# Patient Record
Sex: Male | Born: 1985 | Race: Black or African American | Hispanic: No | Marital: Single | Smoking: Former smoker
Health system: Southern US, Community
[De-identification: ages and names within clinical notes are randomized; demographics above are authoritative.]

## PROBLEM LIST (undated history)

## (undated) DIAGNOSIS — J45909 Unspecified asthma, uncomplicated: Secondary | ICD-10-CM

---
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
## 2012-07-10 ENCOUNTER — Emergency Department (HOSPITAL_COMMUNITY)
Admission: EM | Admit: 2012-07-10 | Discharge: 2012-07-10 | Disposition: A | Discharge status: Home / Self Care | Payer: Self-pay | Attending: Emergency Medicine | Admitting: Emergency Medicine

## 2012-07-10 ENCOUNTER — Encounter (HOSPITAL_COMMUNITY): Payer: Self-pay | Admitting: Emergency Medicine

## 2012-07-10 DIAGNOSIS — E860 Dehydration: Secondary | ICD-10-CM | POA: Insufficient documentation

## 2012-07-10 DIAGNOSIS — F10929 Alcohol use, unspecified with intoxication, unspecified: Secondary | ICD-10-CM

## 2012-07-10 DIAGNOSIS — F1010 Alcohol abuse, uncomplicated: Secondary | ICD-10-CM | POA: Insufficient documentation

## 2012-07-10 LAB — POCT I-STAT, CHEM 8
Chloride: 103 mEq/L (ref 96–112)
HCT: 48 % (ref 39.0–52.0)
Potassium: 4 mEq/L (ref 3.5–5.1)

## 2012-07-10 LAB — ETHANOL: Alcohol, Ethyl (B): 206 mg/dL — ABNORMAL HIGH (ref 0–11)

## 2012-07-10 LAB — RAPID URINE DRUG SCREEN, HOSP PERFORMED: Barbiturates: NOT DETECTED

## 2012-07-10 MED ORDER — SODIUM CHLORIDE 0.9 % IV BOLUS (SEPSIS)
1000.0000 mL | Freq: Once | INTRAVENOUS | Status: AC
  Administered 2012-07-10: 1000 mL via INTRAVENOUS

## 2012-07-10 NOTE — ED Notes (Signed)
Patient intoxicated at this time, awake and alert.  Mom and aunt at bedside.  Mom states that patient is an alcoholic and that he just passed out on porch.  Patient did not have any complaints prior to EMS coming to house.  Patient states that he is fine and wants to go home.

## 2012-07-10 NOTE — ED Notes (Addendum)
Patient found by EMS on porch, lethargic.  Neighbors found him unresponsive prior to EMS arrival.  Patient has pinpoint pupils, denies any intake of drugs.  Patient remains lethargic, but becoming more alert en route to ED.  CBG of 81, ETOH on board.

## 2012-07-10 NOTE — ED Provider Notes (Signed)
History     CSN: [id number]  Arrival date & time 07/10/12  2039   First MD Initiated Contact with Patient 07/10/12 2121      Chief Complaint  Patient presents with  . Ingestion    (Consider location/radiation/quality/duration/timing/severity/associated sxs/prior treatment) HPI Pt brought to the ED via EMS from home where he was apparently found "unresponsive" on the front porch. He admits to drinking a bunch of beer this afternoon, but denies any other ingestions. Denies any other complaints.   History reviewed. No pertinent past medical history.  History reviewed. No pertinent past surgical history.  History reviewed. No pertinent family history.  History  Substance Use Topics  . Smoking status: Never Smoker   . Smokeless tobacco: Not on file  . Alcohol Use: Yes      Review of Systems All other systems reviewed and are negative except as noted in HPI.   Allergies  Strawberry  Home Medications  No current outpatient prescriptions on file.  BP 115/71  Pulse 69  Temp 97.5 F (36.4 C) (Oral)  Resp 16  SpO2 100%  Physical Exam  Nursing note and vitals reviewed. Constitutional: He appears well-developed and well-nourished.  HENT:  Head: Normocephalic and atraumatic.  Eyes: EOM are normal. Pupils are equal, round, and reactive to light.  Neck: Normal range of motion. Neck supple.  Cardiovascular: Normal rate, normal heart sounds and intact distal pulses.   Pulmonary/Chest: Effort normal and breath sounds normal.  Abdominal: Bowel sounds are normal. He exhibits no distension. There is no tenderness.  Musculoskeletal: Normal range of motion. He exhibits no edema and no tenderness.  Neurological: He is alert. He has normal strength. No cranial nerve deficit or sensory deficit.  Skin: Skin is warm and dry. No rash noted.  Psychiatric: He has a normal mood and affect.    ED Course  Procedures (including critical care time)  Labs Reviewed  URINE RAPID DRUG  SCREEN (HOSP PERFORMED) - Abnormal; Notable for the following:    Tetrahydrocannabinol POSITIVE (*)     All other components within normal limits  ETHANOL - Abnormal; Notable for the following:    Alcohol, Ethyl (B) 206 (*)     All other components within normal limits  POCT I-STAT, CHEM 8 - Abnormal; Notable for the following:    BUN 3 (*)     Creatinine, Ser 1.40 (*)     Glucose, Bld 100 (*)     All other components within normal limits   No results found.   No diagnosis found.    MDM  Pt has slightly elevated Cr. Will give IVF bolus. Family at bedside are sober and willing to take patient home after bolus is done infusing.        Charles B. Bernette Mayers, MD 07/10/12 [phone]

## 2015-01-29 ENCOUNTER — Encounter (HOSPITAL_COMMUNITY): Payer: Self-pay | Admitting: *Deleted

## 2015-01-29 ENCOUNTER — Emergency Department (INDEPENDENT_AMBULATORY_CARE_PROVIDER_SITE_OTHER)
Admission: EM | Admit: 2015-01-29 | Discharge: 2015-01-29 | Disposition: A | Discharge status: Home / Self Care | Payer: Self-pay | Source: Home / Self Care | Attending: Family Medicine | Admitting: Family Medicine

## 2015-01-29 DIAGNOSIS — K2920 Alcoholic gastritis without bleeding: Secondary | ICD-10-CM

## 2015-01-29 LAB — COMPREHENSIVE METABOLIC PANEL
ALBUMIN: 4.1 g/dL (ref 3.5–5.2)
ALK PHOS: 54 U/L (ref 39–117)
ALT: 15 U/L (ref 0–53)
ANION GAP: 7 (ref 5–15)
AST: 22 U/L (ref 0–37)
BILIRUBIN TOTAL: 0.9 mg/dL (ref 0.3–1.2)
BUN: 9 mg/dL (ref 6–23)
CALCIUM: 9.7 mg/dL (ref 8.4–10.5)
CO2: 29 mmol/L (ref 19–32)
Chloride: 104 mmol/L (ref 96–112)
Creatinine, Ser: 0.98 mg/dL (ref 0.50–1.35)
GFR calc Af Amer: >90 mL/min (ref >90)
GFR calc non Af Amer: >90 mL/min (ref >90)
Glucose, Bld: 112 mg/dL — ABNORMAL HIGH (ref 70–99)
POTASSIUM: 4 mmol/L (ref 3.5–5.1)
SODIUM: 140 mmol/L (ref 135–145)
TOTAL PROTEIN: 7.1 g/dL (ref 6.0–8.3)

## 2015-01-29 LAB — CBC
HEMATOCRIT: 43.8 % (ref 39.0–52.0)
Hemoglobin: 14.5 g/dL (ref 13.0–17.0)
MCH: 29.1 pg (ref 26.0–34.0)
MCHC: 33.1 g/dL (ref 30.0–36.0)
MCV: 88 fL (ref 78.0–100.0)
PLATELETS: 203 10*3/uL (ref 150–400)
RBC: 4.98 MIL/uL (ref 4.22–5.81)
RDW: 12.9 % (ref 11.5–15.5)
WBC: 7.7 10*3/uL (ref 4.0–10.5)

## 2015-01-29 LAB — LIPASE, BLOOD: LIPASE: 27 U/L (ref 11–59)

## 2015-01-29 MED ORDER — ONDANSETRON HCL 4 MG/2ML IJ SOLN
INTRAMUSCULAR | Status: AC
  Filled 2015-01-29: qty 2

## 2015-01-29 MED ORDER — OMEPRAZOLE 40 MG PO CPDR: 40.0000 mg | DELAYED_RELEASE_CAPSULE | Freq: Every day | ORAL | Status: DC

## 2015-01-29 MED ORDER — SODIUM CHLORIDE 0.9 % IV BOLUS (SEPSIS)
1000.0000 mL | Freq: Once | INTRAVENOUS | Status: AC
  Administered 2015-01-29: 1000 mL via INTRAVENOUS

## 2015-01-29 MED ORDER — GI COCKTAIL ~~LOC~~
30.0000 mL | Freq: Once | ORAL | Status: AC
  Administered 2015-01-29: 30 mL via ORAL

## 2015-01-29 MED ORDER — GI COCKTAIL ~~LOC~~
ORAL | Status: AC
  Filled 2015-01-29: qty 30

## 2015-01-29 MED ORDER — ONDANSETRON HCL 4 MG/2ML IJ SOLN
4.0000 mg | Freq: Once | INTRAMUSCULAR | Status: AC
Start: 2015-01-29 — End: 2015-01-29
  Administered 2015-01-29: 4 mg via INTRAVENOUS

## 2015-01-29 NOTE — ED Notes (Signed)
Pt  Reports  abd  Pain  l  Side   With  Some  Vomiting   Symptoms  Started  Over  The  Weekend     -             Pt  Has  Some   abd  Pain  As  Well

## 2015-01-29 NOTE — Discharge Instructions (Signed)
Thank you for coming in today. Don't drink alcohol for a while Take omeprazole daily Return as needed If your belly pain worsens, or you have high fever, bad vomiting, blood in your stool or black tarry stool go to the Emergency Room.    Gastritis, Adult Gastritis is soreness and swelling (inflammation) of the lining of the stomach. Gastritis can develop as a sudden onset (acute) or long-term (chronic) condition. If gastritis is not treated, it can lead to stomach bleeding and ulcers. CAUSES  Gastritis occurs when the stomach lining is weak or damaged. Digestive juices from the stomach then inflame the weakened stomach lining. The stomach lining may be weak or damaged due to viral or bacterial infections. One common bacterial infection is the Helicobacter pylori infection. Gastritis can also result from excessive alcohol consumption, taking certain medicines, or having too much acid in the stomach.  SYMPTOMS  In some cases, there are no symptoms. When symptoms are present, they may include:  Pain or a burning sensation in the upper abdomen.  Nausea.  Vomiting.  An uncomfortable feeling of fullness after eating. DIAGNOSIS  Your caregiver may suspect you have gastritis based on your symptoms and a physical exam. To determine the cause of your gastritis, your caregiver may perform the following:  Blood or stool tests to check for the H pylori bacterium.  Gastroscopy. A thin, flexible tube (endoscope) is passed down the esophagus and into the stomach. The endoscope has a light and camera on the end. Your caregiver uses the endoscope to view the inside of the stomach.  Taking a tissue sample (biopsy) from the stomach to examine under a microscope. TREATMENT  Depending on the cause of your gastritis, medicines may be prescribed. If you have a bacterial infection, such as an H pylori infection, antibiotics may be given. If your gastritis is caused by too much acid in the stomach, H2 blockers  or antacids may be given. Your caregiver may recommend that you stop taking aspirin, ibuprofen, or other nonsteroidal anti-inflammatory drugs (NSAIDs). HOME CARE INSTRUCTIONS  Only take over-the-counter or prescription medicines as directed by your caregiver.  If you were given antibiotic medicines, take them as directed. Finish them even if you start to feel better.  Drink enough fluids to keep your urine clear or pale yellow.  Avoid foods and drinks that make your symptoms worse, such as:  Caffeine or alcoholic drinks.  Chocolate.  Peppermint or mint flavorings.  Garlic and onions.  Spicy foods.  Citrus fruits, such as oranges, lemons, or limes.  Tomato-based foods such as sauce, chili, salsa, and pizza.  Fried and fatty foods.  Eat small, frequent meals instead of large meals. SEEK IMMEDIATE MEDICAL CARE IF:   You have black or dark red stools.  You vomit blood or material that looks like coffee grounds.  You are unable to keep fluids down.  Your abdominal pain gets worse.  You have a fever.  You do not feel better after 1 week.  You have any other questions or concerns. MAKE SURE YOU:  Understand these instructions.  Will watch your condition.  Will get help right away if you are not doing well or get worse. Document Released: 12/02/2001 Document Revised: 06/08/2012 Document Reviewed: 01/21/2012 [facility] Patient Information 2015 [place], [place]. This information is not intended to replace advice given to you by your health care provider. Make sure you discuss any questions you have with your health care provider.

## 2015-01-29 NOTE — ED Provider Notes (Signed)
[name] is a 29 y.o. male who presents to Urgent Care today for vomiting left upper quadrant pain. Patient was in his normal state of health until Saturday evening when he drank a lot of alcohol. He's had vomiting Saturday and into Sunday. He has since developed upper left quadrant abdominal pain. He denies any diarrhea. He denies any blood in his stool or his vomit or coffee-ground emesis or melanotic stool. He denies any fevers. He is no longer vomiting but continues to have some mild to moderate abdominal pain. The pain is slightly better to unchanged. He has not tried any medications. No fevers or chills.   History reviewed. No pertinent past medical history. History reviewed. No pertinent past surgical history. History  Substance Use Topics  . Smoking status: Never Smoker   . Smokeless tobacco: Not on file  . Alcohol Use: Yes   ROS as above Medications: No current facility-administered medications for this encounter.   Current Outpatient Prescriptions  Medication Sig Dispense Refill  . omeprazole (PRILOSEC) 40 MG capsule Take 1 capsule (40 mg total) by mouth daily. 30 capsule 1   Allergies  Allergen Reactions  . Strawberry Anaphylaxis     Exam:  BP 133/59 mmHg  Pulse 61  Temp(Src) 98.1 F (36.7 C) (Oral)  Resp 16  SpO2 96% Gen: Well NAD HEENT: EOMI,  MMM Lungs: Normal work of breathing. CTABL Heart: RRR no MRG Abd: NABS, Soft. Nondistended, tender palpation upper left quadrant without rebound or guarding. Exts: Brisk capillary refill, warm and well perfused.   Patient was given a 1 L normal saline bolus, 4 mg of IV Zofran, and a GI cocktail, and felt much better. Patient no longer had any abdominal pain.  Results for orders placed or performed during the hospital encounter of 01/29/15 (from the past 24 hour(s))  CBC     Status: None   Collection Time: 01/29/15  8:52 AM  Result Value Ref Range   WBC 7.7 4.0 - 10.5 K/uL   RBC 4.98 4.22 - 5.81 MIL/uL   Hemoglobin 14.5 13.0 - 17.0 g/dL   HCT 47.843.8 29.539.0 - 62.152.0 %   MCV 88.0 78.0 - 100.0 fL   MCH 29.1 26.0 - 34.0 pg   MCHC 33.1 30.0 - 36.0 g/dL   RDW 30.812.9 65.711.5 - 84.615.5 %   Platelets 203 150 - 400 K/uL  Comprehensive metabolic panel     Status: Abnormal   Collection Time: 01/29/15  8:52 AM  Result Value Ref Range   Sodium 140 135 - 145 mmol/L   Potassium 4.0 3.5 - 5.1 mmol/L   Chloride 104 96 - 112 mmol/L   CO2 29 19 - 32 mmol/L   Glucose, Bld 112 (H) 70 - 99 mg/dL   BUN 9 6 - 23 mg/dL   Creatinine, Ser 9.620.98 0.50 - 1.35 mg/dL   Calcium 9.7 8.4 - 95.210.5 mg/dL   Total Protein 7.1 6.0 - 8.3 g/dL   Albumin 4.1 3.5 - 5.2 g/dL   AST 22 0 - 37 U/L   ALT 15 0 - 53 U/L   Alkaline Phosphatase 54 39 - 117 U/L   Total Bilirubin 0.9 0.3 - 1.2 mg/dL   GFR calc non Af Amer >90 >90 mL/min   GFR calc Af Amer >90 >90 mL/min   Anion gap 7 5 - 15  Lipase, blood     Status: None   Collection Time: 01/29/15  8:52 AM  Result Value Ref Range   Lipase  27 11 - 59 U/L   No results found.  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 Abstaining from alcohol. Omeprazole. Return as needed.  Discussed warning signs or symptoms. Please see discharge instructions. Patient expresses understanding.     Rodolph Bong, MD 01/29/15 1027

## 2016-03-15 ENCOUNTER — Encounter (HOSPITAL_COMMUNITY): Payer: Self-pay | Admitting: Emergency Medicine

## 2016-03-15 ENCOUNTER — Emergency Department (HOSPITAL_COMMUNITY)
Admission: EM | Admit: 2016-03-15 | Discharge: 2016-03-15 | Disposition: A | Discharge status: Home / Self Care | Payer: Self-pay | Attending: Emergency Medicine | Admitting: Emergency Medicine

## 2016-03-15 ENCOUNTER — Emergency Department (HOSPITAL_COMMUNITY): Payer: Self-pay

## 2016-03-15 DIAGNOSIS — R52 Pain, unspecified: Secondary | ICD-10-CM

## 2016-03-15 DIAGNOSIS — R05 Cough: Secondary | ICD-10-CM

## 2016-03-15 DIAGNOSIS — Z79899 Other long term (current) drug therapy: Secondary | ICD-10-CM | POA: Insufficient documentation

## 2016-03-15 DIAGNOSIS — R079 Chest pain, unspecified: Secondary | ICD-10-CM

## 2016-03-15 DIAGNOSIS — F17210 Nicotine dependence, cigarettes, uncomplicated: Secondary | ICD-10-CM | POA: Insufficient documentation

## 2016-03-15 DIAGNOSIS — J45909 Unspecified asthma, uncomplicated: Secondary | ICD-10-CM | POA: Insufficient documentation

## 2016-03-15 DIAGNOSIS — R112 Nausea with vomiting, unspecified: Secondary | ICD-10-CM

## 2016-03-15 DIAGNOSIS — R059 Cough, unspecified: Secondary | ICD-10-CM

## 2016-03-15 HISTORY — DX: Unspecified asthma, uncomplicated: J45.909

## 2016-03-15 LAB — BASIC METABOLIC PANEL
ANION GAP: 8 (ref 5–15)
BUN: 8 mg/dL (ref 6–20)
CHLORIDE: 105 mmol/L (ref 101–111)
CO2: 27 mmol/L (ref 22–32)
Calcium: 9.3 mg/dL (ref 8.9–10.3)
Creatinine, Ser: 0.95 mg/dL (ref 0.61–1.24)
GFR calc Af Amer: >60 mL/min (ref >60)
GLUCOSE: 76 mg/dL (ref 65–99)
POTASSIUM: 4 mmol/L (ref 3.5–5.1)
Sodium: 140 mmol/L (ref 135–145)

## 2016-03-15 LAB — CBC WITH DIFFERENTIAL/PLATELET
BASOS PCT: 0 %
Basophils Absolute: 0 10*3/uL (ref 0.0–0.1)
EOS PCT: 1 %
Eosinophils Absolute: 0.1 10*3/uL (ref 0.0–0.7)
HEMATOCRIT: 37.7 % — AB (ref 39.0–52.0)
Hemoglobin: 12.7 g/dL — ABNORMAL LOW (ref 13.0–17.0)
Lymphocytes Relative: 29 %
Lymphs Abs: 1.9 10*3/uL (ref 0.7–4.0)
MCH: 28.7 pg (ref 26.0–34.0)
MCHC: 33.7 g/dL (ref 30.0–36.0)
MCV: 85.3 fL (ref 78.0–100.0)
MONO ABS: 0.4 10*3/uL (ref 0.1–1.0)
MONOS PCT: 7 %
NEUTROS PCT: 63 %
Neutro Abs: 4 10*3/uL (ref 1.7–7.7)
PLATELETS: 189 10*3/uL (ref 150–400)
RBC: 4.42 MIL/uL (ref 4.22–5.81)
RDW: 12.6 % (ref 11.5–15.5)
WBC: 6.4 10*3/uL (ref 4.0–10.5)

## 2016-03-15 LAB — I-STAT TROPONIN, ED: Troponin i, poc: 0 ng/mL (ref 0.00–0.08)

## 2016-03-15 MED ORDER — ONDANSETRON HCL 4 MG/2ML IJ SOLN
4.0000 mg | Freq: Once | INTRAMUSCULAR | Status: AC
  Administered 2016-03-15: 4 mg via INTRAVENOUS
  Filled 2016-03-15: qty 2

## 2016-03-15 MED ORDER — IPRATROPIUM-ALBUTEROL 0.5-2.5 (3) MG/3ML IN SOLN
3.0000 mL | Freq: Once | RESPIRATORY_TRACT | Status: AC
  Administered 2016-03-15: 3 mL via RESPIRATORY_TRACT
  Filled 2016-03-15: qty 3

## 2016-03-15 MED ORDER — SODIUM CHLORIDE 0.9 % IV BOLUS (SEPSIS)
1000.0000 mL | Freq: Once | INTRAVENOUS | Status: AC
  Administered 2016-03-15: 1000 mL via INTRAVENOUS

## 2016-03-15 MED ORDER — KETOROLAC TROMETHAMINE 30 MG/ML IJ SOLN
30.0000 mg | Freq: Once | INTRAMUSCULAR | Status: AC
  Administered 2016-03-15: 30 mg via INTRAVENOUS
  Filled 2016-03-15: qty 1

## 2016-03-15 NOTE — ED Notes (Signed)
Pt states he started having sharp chest pains yesterday while working and intermittent sob. Pt states he had to leave work today because was having substernal sharp chest pains.

## 2016-03-15 NOTE — Discharge Instructions (Signed)
As discussed, you have been diagnosed with an influenza-like illness.  Typically symptoms improve over 3 or 4 days with fluids, rest. Please use ibuprofen, Tylenol for symptom relief.  Please be sure to follow-up with our primary care center for further evaluation, management. Do not hesitate to return here if you develop new, or concerning changes in your condition.

## 2016-03-15 NOTE — ED Notes (Signed)
Phlebotomy at the bedside  

## 2016-03-15 NOTE — ED Provider Notes (Signed)
CSN: [id number]     Arrival date & time 03/15/16  1844 History   First MD Initiated Contact with Patient 03/15/16 1846     Chief Complaint  Patient presents with  . Chest Pain     (Consider location/radiation/quality/duration/timing/severity/associated sxs/prior Treatment) HPI   [name] is a 30 y.o. male, with a history of asthma, presenting to the ED with chest pain that began yesterday. Pt states the pain is intermittent, located in the central chest, sharp in nature, rates it 3/10 currently, nonradiating. Pt also endorses a productive cough, a headache, chills, body aches, and N/V that began last night. Pt states "it feels similar to my asthma chest tightness sometimes and sometimes it feels like acid reflux." Pt tried taking Tums without relief. Pt states he has not been taking his Prilosec. Pt denies shortness of breath, abdominal pain, or any other complaints.    Past Medical History  Diagnosis Date  . Asthma    History reviewed. No pertinent past surgical history. No family history on file. Social History  Substance Use Topics  . Smoking status: Current Every Day Smoker -- 0.50 packs/day    Types: Cigarettes  . Smokeless tobacco: None  . Alcohol Use: Yes    Review of Systems  Constitutional: Positive for chills. Negative for fever.  Respiratory: Positive for cough and chest tightness. Negative for shortness of breath.   Gastrointestinal: Positive for nausea and vomiting. Negative for abdominal pain.  Skin: Negative for color change and pallor.  Neurological: Positive for headaches.  All other systems reviewed and are negative.     Allergies  Strawberry extract  Home Medications   Prior to Admission medications   Medication Sig Start Date End Date Taking? Authorizing Provider  omeprazole (PRILOSEC) 40 MG capsule Take 1 capsule (40 mg total) by mouth daily. 01/29/15   Rodolph Bong, MD   BP 119/75 mmHg  Pulse 55  Temp(Src) 98.1 F (36.7 C) (Oral)   Resp 15  Ht  (1.854 m)  Wt 74.844 kg  BMI 21.77 kg/m2  SpO2 100% Physical Exam  Constitutional: He appears well-developed and well-nourished. No distress.  HENT:  Head: Normocephalic and atraumatic.  Eyes: Conjunctivae are normal. Pupils are equal, round, and reactive to light.  Neck: Neck supple.  Cardiovascular: Normal rate, regular rhythm, normal heart sounds and intact distal pulses.   Pulmonary/Chest: Effort normal and breath sounds normal. No respiratory distress.  No chest wall tenderness.  Abdominal: Soft. There is no tenderness. There is no guarding.  Musculoskeletal: He exhibits no edema or tenderness.  Lymphadenopathy:    He has no cervical adenopathy.  Neurological: He is alert.  Skin: Skin is warm and dry. He is not diaphoretic.  Psychiatric: He has a normal mood and affect. His behavior is normal.  Nursing note and vitals reviewed.   ED Course  Procedures (including critical care time) Labs Review Labs Reviewed  CBC WITH DIFFERENTIAL/PLATELET  BASIC METABOLIC PANEL  Rosezena Sensor, ED      EKG Interpretation   Date/Time:  Saturday March 15 2016 18:47:57 EDT Ventricular Rate:  63 PR Interval:  171 QRS Duration: 97 QT Interval:  389 QTC Calculation: 398 R Axis:   88 Text Interpretation:  Sinus rhythm RSR' in V1 or V2, probably normal  variant Probable left ventricular hypertrophy Abnrm T, consider ischemia,  anterolateral lds ST elev, probable normal early repol pattern Sinus  rhythm Left ventricular hypertrophy Early repolarization pattern Abnormal  ekg Confirmed by Gerhard Munch  MD [phone]([id number]) on 03/15/2016 7:00:37 PM      MDM   Final diagnoses:  Chest pain, unspecified chest pain type  Cough  Body aches  Non-intractable vomiting with nausea, vomiting of unspecified type    [name] presents with chest pain, cough, nausea/vomiting, and body aches since yesterday.  Suspect that the source of the patient's symptoms is a viral  illness, such as influenza. Patient is nontoxic appearing, afebrile, not tachycardic, not tachypneic, maintains SPO2 of 99-100% on room air, and is in no apparent distress. Patient has no signs of sepsis or other serious or life-threatening condition. Low suspicion for ACS. HEART score is 0, indicating low risk for a cardiac event. Wells criteria score is 0, indicating low risk for PE. Chest x-ray, IV fluids, pain management, Zofran, and troponin.  Dr. Jeraldine LootsLockwood took over patient care upon the end of my shift. Plan: If labs and imaging are normal, discharge patient home with prescriptions and instructions for symptomatic care.  Filed Vitals:   03/15/16 1851 03/15/16 1900 03/15/16 1915 03/15/16 1936  BP: 119/68 124/69 123/75 119/75  Pulse: 62 61 58 55  Temp: 98.4 F (36.9 C)   98.1 F (36.7 C)  TempSrc: Oral   Oral  Resp:  22 16 15   Height: 6\' 1"  (1.854 m)     Weight: 74.844 kg     SpO2: 99% 100% 99% 100%       Anselm PancoastShawn C Joy, PA-C 03/15/16 1941  Gerhard Munchobert Lockwood, MD 03/15/16 2140

## 2016-12-19 ENCOUNTER — Emergency Department (HOSPITAL_COMMUNITY): Payer: Self-pay

## 2016-12-19 ENCOUNTER — Encounter (HOSPITAL_COMMUNITY): Payer: Self-pay | Admitting: Neurology

## 2016-12-19 ENCOUNTER — Emergency Department (HOSPITAL_COMMUNITY)
Admission: EM | Admit: 2016-12-19 | Discharge: 2016-12-19 | Disposition: A | Discharge status: Home / Self Care | Payer: Self-pay | Attending: Emergency Medicine | Admitting: Emergency Medicine

## 2016-12-19 DIAGNOSIS — Z79899 Other long term (current) drug therapy: Secondary | ICD-10-CM | POA: Insufficient documentation

## 2016-12-19 DIAGNOSIS — R079 Chest pain, unspecified: Secondary | ICD-10-CM | POA: Insufficient documentation

## 2016-12-19 DIAGNOSIS — F17210 Nicotine dependence, cigarettes, uncomplicated: Secondary | ICD-10-CM | POA: Insufficient documentation

## 2016-12-19 DIAGNOSIS — J45909 Unspecified asthma, uncomplicated: Secondary | ICD-10-CM | POA: Insufficient documentation

## 2016-12-19 LAB — BASIC METABOLIC PANEL
Anion gap: 10 (ref 5–15)
BUN: 8 mg/dL (ref 6–20)
CALCIUM: 9.5 mg/dL (ref 8.9–10.3)
CO2: 23 mmol/L (ref 22–32)
CREATININE: 1.03 mg/dL (ref 0.61–1.24)
Chloride: 105 mmol/L (ref 101–111)
Glucose, Bld: 119 mg/dL — ABNORMAL HIGH (ref 65–99)
Potassium: 3.8 mmol/L (ref 3.5–5.1)
SODIUM: 138 mmol/L (ref 135–145)

## 2016-12-19 LAB — CBC
HCT: 43.4 % (ref 39.0–52.0)
HEMOGLOBIN: 15 g/dL (ref 13.0–17.0)
MCH: 28.9 pg (ref 26.0–34.0)
MCHC: 34.6 g/dL (ref 30.0–36.0)
MCV: 83.6 fL (ref 78.0–100.0)
PLATELETS: 213 10*3/uL (ref 150–400)
RBC: 5.19 MIL/uL (ref 4.22–5.81)
RDW: 12.4 % (ref 11.5–15.5)
WBC: 6.3 10*3/uL (ref 4.0–10.5)

## 2016-12-19 LAB — I-STAT TROPONIN, ED: TROPONIN I, POC: 0 ng/mL (ref 0.00–0.08)

## 2016-12-19 MED ORDER — OMEPRAZOLE 20 MG PO CPDR: 20.0000 mg | DELAYED_RELEASE_CAPSULE | Freq: Every day | ORAL | 0 refills | Status: DC

## 2016-12-19 MED ORDER — ALBUTEROL SULFATE HFA 108 (90 BASE) MCG/ACT IN AERS
2.0000 | INHALATION_SPRAY | Freq: Once | RESPIRATORY_TRACT | Status: AC
  Administered 2016-12-19: 2 via RESPIRATORY_TRACT
  Filled 2016-12-19: qty 6.7

## 2016-12-19 NOTE — ED Triage Notes (Signed)
Pt reports right sided cp since today while working. Pt is very anxious in triage and crying. Denies SOB, n/v/d. Lung sounds are clear. Pt is a x 4, but reports hard to take a deep breath. He also mentions it is hard for him to have a bowel movement.

## 2016-12-19 NOTE — ED Provider Notes (Signed)
[facility]-EMERGENCY DEPT Provider Note   CSN: [id number] Arrival date & time: 12/19/16  1812    History   Chief Complaint Chief Complaint  Patient presents with  . Chest Pain    HPI [name] is a 30 y.o. male.  30 year old male with a history of asthma presents to the emergency department for evaluation of chest pain. Patient reports a gripping central chest pain which was nonradiating. It began this afternoon and has since resolved. Symptoms began after smoking marijuana. Patient denies any recent cocaine use, though he has used this in the past. He states that he felt as though he had some mucus in his chest that he couldn't get out. He states that he felt as though he needed an albuterol inhaler, though he denies any associated wheezing. He did have some mild lightheadedness, but denies any syncope. No recent leg swelling, surgeries, hospitalizations, or hemoptysis. No fevers or sick contacts. No medications taken PTA for symptoms.      Past Medical History:  Diagnosis Date  . Asthma     There are no active problems to display for this patient.   History reviewed. No pertinent surgical history.    Home Medications    Prior to Admission medications   Medication Sig Start Date End Date Taking? Authorizing Provider  calcium elemental as carbonate (TUMS ULTRA 1000) 400 MG chewable tablet Chew 2,000 mg by mouth once.    Historical Provider, MD  omeprazole (PRILOSEC) 20 MG capsule Take 1 capsule (20 mg total) by mouth daily. 12/19/16   Antony MaduraKelly Zilphia Kozinski, PA-C    Family History No family history on file.  Social History Social History  Substance Use Topics  . Smoking status: Current Every Day Smoker    Packs/day: 0.50    Types: Cigarettes  . Smokeless tobacco: Not on file  . Alcohol use Yes     Allergies   Strawberry extract   Review of Systems Review of Systems Ten systems reviewed and are negative for acute change, except as noted in the HPI.     Physical Exam Updated Vital Signs BP 117/69 (BP Location: Left Arm)   Pulse 62   Temp 98 F (36.7 C) (Oral)   Resp 20   Ht 6' (1.829 m)   Wt 72.6 kg   SpO2 99%   BMI 21.70 kg/m   Physical Exam  Constitutional: He is oriented to person, place, and time. He appears well-developed and well-nourished. No distress.  Nontoxic and in NAD  HENT:  Head: Normocephalic and atraumatic.  Eyes: Conjunctivae and EOM are normal. No scleral icterus.  Neck: Normal range of motion.  Cardiovascular: Normal rate, regular rhythm and intact distal pulses.   Pulmonary/Chest: Effort normal and breath sounds normal. No respiratory distress. He has no wheezes. He has no rales.  Lungs CTAB. Chest expansion symmetric.  Musculoskeletal: Normal range of motion.  Neurological: He is alert and oriented to person, place, and time. He exhibits normal muscle tone. Coordination normal.  GCS 15. Patient moving all extremities; ambulatory with steady gait.  Skin: Skin is warm and dry. No rash noted. He is not diaphoretic. No erythema. No pallor.  Psychiatric: He has a normal mood and affect. His behavior is normal.  Nursing note and vitals reviewed.    ED Treatments / Results  Labs (all labs ordered are listed, but only abnormal results are displayed) Labs Reviewed  BASIC METABOLIC PANEL - Abnormal; Notable for the following:       Result Value  Glucose, Bld 119 (*)    All other components within normal limits  CBC  I-STAT TROPOININ, ED    EKG  EKG Interpretation  Date/Time:  Friday December 19 2016 18:22:28 EST Ventricular Rate:  83 PR Interval:  146 QRS Duration: 96 QT Interval:  360 QTC Calculation: 423 R Axis:   81 Text Interpretation:  Normal sinus rhythm with sinus arrhythmia Right atrial enlargement Moderate voltage criteria for LVH, may be normal variant Borderline ECG No significant change since last tracing Confirmed by ISAACS MD, CAMERON [phone]([id number]) on 12/19/2016 10:36:26 PM        Radiology Dg Chest 2 View  Result Date: 12/19/2016 CLINICAL DATA:  Chest pain EXAM: CHEST  2 VIEW COMPARISON:  Radiograph 03/15/2016 FINDINGS: Normal mediastinum and cardiac silhouette. Normal pulmonary vasculature. No evidence of effusion, infiltrate, or pneumothorax. No acute bony abnormality. IMPRESSION: Normal chest radiograph Electronically Signed   By: Genevive BiStewart  Edmunds M.D.   On: 12/19/2016 19:22    Procedures Procedures (including critical care time)  Medications Ordered in ED Medications  albuterol (PROVENTIL HFA;VENTOLIN HFA) 108 (90 Base) MCG/ACT inhaler 2 puff (not administered)     Initial Impression / Assessment and Plan / ED Course  I have reviewed the triage vital signs and the nursing notes.  Pertinent labs & imaging results that were available during my care of the patient were reviewed by me and considered in my medical decision making (see chart for details).  Clinical Course     30 year old male presents to the emergency department for evaluation of nonspecific chest pain which resolved spontaneously prior to my evaluation. No cardiac history or risk factors for heart disease. Heart score is 0 consistent with low risk of acute coronary syndrome. Patient also with low risk for pulmonary embolus. He is PERC negative. Doubt PE. No mediastinal widening on CXR to suggest dissection. No PNA, PTX, vascular congestion or pleural effusion.   Low suspicion for emergent etiology. Will manage on an outpatient basis and have advised PCP follow up. Patient requesting albuterol inhaler. Will provide. Also plan to start on PPI should symptoms be related to gastritis. Unclear whether symptoms may also, in part, be due to marijuana use. Return precautions discussed and provided. Patient discharged in stable condition with no unaddressed concerns.   Vitals:   12/19/16 1823 12/19/16 1824 12/19/16 2116  BP:  (!) 132/101 117/69  Pulse:  81 62  Resp:  20 20  Temp:  97.2 F (36.2  C) 98 F (36.7 C)  TempSrc:  Oral Oral  SpO2:  100% 99%  Weight: 72.6 kg    Height: 6' (1.829 m)      Final Clinical Impressions(s) / ED Diagnoses   Final diagnoses:  Chest pain, unspecified type    New Prescriptions New Prescriptions   OMEPRAZOLE (PRILOSEC) 20 MG CAPSULE    Take 1 capsule (20 mg total) by mouth daily.     Antony MaduraKelly Lacye Mccarn, PA-C 12/19/16 2324    Shaune Pollackameron Isaacs, MD 12/21/16 [phone]

## 2016-12-19 NOTE — Discharge Instructions (Signed)
Follow up with a primary care doctor regarding your visit today. For asthma related symptoms, use 2 puffs of an albuterol inhaler every 4-6 hours as needed. Take Prilosec, for acid reflux, as prescribed.

## 2017-01-08 ENCOUNTER — Encounter (HOSPITAL_COMMUNITY): Payer: Self-pay

## 2017-01-08 ENCOUNTER — Emergency Department (HOSPITAL_COMMUNITY)
Admission: EM | Admit: 2017-01-08 | Discharge: 2017-01-08 | Disposition: A | Discharge status: Home / Self Care | Payer: Self-pay | Attending: Emergency Medicine | Admitting: Emergency Medicine

## 2017-01-08 DIAGNOSIS — Z79899 Other long term (current) drug therapy: Secondary | ICD-10-CM | POA: Insufficient documentation

## 2017-01-08 DIAGNOSIS — Z202 Contact with and (suspected) exposure to infections with a predominantly sexual mode of transmission: Secondary | ICD-10-CM | POA: Insufficient documentation

## 2017-01-08 DIAGNOSIS — F17210 Nicotine dependence, cigarettes, uncomplicated: Secondary | ICD-10-CM | POA: Insufficient documentation

## 2017-01-08 MED ORDER — AZITHROMYCIN 250 MG PO TABS
1000.0000 mg | ORAL_TABLET | Freq: Once | ORAL | Status: AC
  Administered 2017-01-08: 1000 mg via ORAL
  Filled 2017-01-08: qty 4

## 2017-01-08 MED ORDER — CEFTRIAXONE SODIUM 250 MG IJ SOLR
250.0000 mg | Freq: Once | INTRAMUSCULAR | Status: AC
  Administered 2017-01-08: 250 mg via INTRAMUSCULAR
  Filled 2017-01-08: qty 250

## 2017-01-08 NOTE — ED Provider Notes (Signed)
[facility]-EMERGENCY DEPT Provider Note   CSN: [id number] Arrival date & time: 01/08/17  1344     History   Chief Complaint Chief Complaint  Patient presents with  . Exposure to STD    HPI [name] is a 31 y.o. male.  Patient has been sexually active with this partner for the last 4-5 months. This is his only partner. He does not use protection.   The history is provided by the patient.  Exposure to STD  This is a new problem. Episode onset: patient's girlfriend called him 4 days ago and let him know she was positive for gonorrhea and chlamydia. The problem has not changed since onset.Associated symptoms comments: He denies any penile discharge, dysuria, urgency or frequency. He has been asymptomatic.Marland Kitchen Nothing aggravates the symptoms. Nothing relieves the symptoms. He has tried nothing for the symptoms.    Past Medical History:  Diagnosis Date  . Asthma     There are no active problems to display for this patient.   History reviewed. No pertinent surgical history.     Home Medications    Prior to Admission medications   Medication Sig Start Date End Date Taking? Authorizing Provider  calcium elemental as carbonate (TUMS ULTRA 1000) 400 MG chewable tablet Chew 2,000 mg by mouth once.    Historical Provider, MD  omeprazole (PRILOSEC) 20 MG capsule Take 1 capsule (20 mg total) by mouth daily. 12/19/16   Antony Madura, PA-C    Family History History reviewed. No pertinent family history.  Social History Social History  Substance Use Topics  . Smoking status: Current Every Day Smoker    Packs/day: 0.50    Types: Cigarettes  . Smokeless tobacco: Never Used  . Alcohol use Yes     Allergies   Strawberry extract   Review of Systems Review of Systems  All other systems reviewed and are negative.    Physical Exam Updated Vital Signs BP 129/77 (BP Location: Left Arm)   Pulse 82   Temp 98 F (36.7 C) (Oral)   Resp 16   Ht 6' (1.829 m)   Wt 160 lb  (72.6 kg)   SpO2 97%   BMI 21.70 kg/m   Physical Exam  Constitutional: He is oriented to person, place, and time. He appears well-developed and well-nourished. No distress.  HENT:  Head: Normocephalic and atraumatic.  Cardiovascular: Normal rate.   Abdominal: Soft. There is no tenderness.  Neurological: He is alert and oriented to person, place, and time.  Skin: Skin is warm and dry.  Psychiatric: He has a normal mood and affect. His behavior is normal.     ED Treatments / Results  Labs (all labs ordered are listed, but only abnormal results are displayed) Labs Reviewed - No data to display  EKG  EKG Interpretation None       Radiology No results found.  Procedures Procedures (including critical care time)  Medications Ordered in ED Medications  cefTRIAXone (ROCEPHIN) injection 250 mg (not administered)  azithromycin (ZITHROMAX) tablet 1,000 mg (not administered)     Initial Impression / Assessment and Plan / ED Course  I have reviewed the triage vital signs and the nursing notes.  Pertinent labs & imaging results that were available during my care of the patient were reviewed by me and considered in my medical decision making (see chart for details).    Patient here with known exposure to gonorrhea and chlamydia. He was called by his partner 4 days ago. He denies  any dysuria, penile discharge, swelling or pain in the genital area. Will give prophylactic antibiotics due to concern for exposure with Rocephin and azithromycin.  Final Clinical Impressions(s) / ED Diagnoses   Final diagnoses:  STD exposure    New Prescriptions New Prescriptions   No medications on file     Gwyneth SproutWhitney Helvi Royals, MD 01/08/17 1420
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
## 2017-01-09 LAB — SYPHILIS: RPR W/REFLEX TO RPR TITER AND TREPONEMAL ANTIBODIES, TRADITIONAL SCREENING AND DIAGNOSIS ALGORITHM: RPR Ser Ql: NONREACTIVE

## 2017-01-09 LAB — HIV ANTIBODY (ROUTINE TESTING W REFLEX): HIV Screen 4th Generation wRfx: NONREACTIVE

## 2018-04-30 ENCOUNTER — Emergency Department (HOSPITAL_COMMUNITY)
Admission: EM | Admit: 2018-04-30 | Discharge: 2018-05-01 | Disposition: A | Discharge status: Home / Self Care | Payer: Self-pay | Attending: Emergency Medicine | Admitting: Emergency Medicine

## 2018-04-30 ENCOUNTER — Encounter (HOSPITAL_COMMUNITY): Payer: Self-pay | Admitting: Emergency Medicine

## 2018-04-30 ENCOUNTER — Other Ambulatory Visit: Payer: Self-pay

## 2018-04-30 DIAGNOSIS — K0889 Other specified disorders of teeth and supporting structures: Secondary | ICD-10-CM

## 2018-04-30 DIAGNOSIS — Z79899 Other long term (current) drug therapy: Secondary | ICD-10-CM | POA: Insufficient documentation

## 2018-04-30 DIAGNOSIS — K0381 Cracked tooth: Secondary | ICD-10-CM | POA: Insufficient documentation

## 2018-04-30 DIAGNOSIS — K029 Dental caries, unspecified: Secondary | ICD-10-CM | POA: Insufficient documentation

## 2018-04-30 DIAGNOSIS — F17210 Nicotine dependence, cigarettes, uncomplicated: Secondary | ICD-10-CM | POA: Insufficient documentation

## 2018-04-30 DIAGNOSIS — J45909 Unspecified asthma, uncomplicated: Secondary | ICD-10-CM | POA: Insufficient documentation

## 2018-04-30 NOTE — ED Triage Notes (Signed)
Reports "rotten teeth" on right upper and lower for years.  States increased pain x 3 weeks.

## 2018-05-01 MED ORDER — LIDOCAINE VISCOUS 2 % MT SOLN: 20.0000 mL | OROMUCOSAL | 0 refills | Status: DC | PRN

## 2018-05-01 NOTE — ED Provider Notes (Signed)
[facility] EMERGENCY DEPARTMENT Provider Note   CSN: [id number] Arrival date & time: 04/30/18  2349     History   Chief Complaint Chief Complaint  Patient presents with  . Dental Pain    HPI [name] is a 32 y.o. male here for evaluation of dental pain to right bottom gumline x 4 days.  Has had intermittent dental pain from broken teeeth and abscesses in the past for a long time but does not have a dentist or dental insurance. Has been using listerine and salt water rinses to help with dental pain for last 4 days without relief. Could not tolerate pain and go to sleep tonight. He denies fevers, chills, trismus, drooling, facial swelling.   HPI  Past Medical History:  Diagnosis Date  . Asthma     There are no active problems to display for this patient.   History reviewed. No pertinent surgical history.      Home Medications    Prior to Admission medications   Medication Sig Start Date End Date Taking? Authorizing Provider  calcium elemental as carbonate (TUMS ULTRA 1000) 400 MG chewable tablet Chew 2,000 mg by mouth once.    [provider]  lidocaine (XYLOCAINE) 2 % solution Use as directed 20 mLs in the mouth or throat every 4 (four) hours as needed for mouth pain (dental and gumline pain). 05/01/18   Liberty Handy, PA-C  omeprazole (PRILOSEC) 20 MG capsule Take 1 capsule (20 mg total) by mouth daily. 12/19/16   Antony Madura, PA-C    Family History No family history on file.  Social History Social History   Tobacco Use  . Smoking status: Current Every Day Smoker    Packs/day: 0.50    Types: Cigarettes  . Smokeless tobacco: Never Used  Substance Use Topics  . Alcohol use: Yes  . Drug use: Yes     Allergies   Strawberry extract   Review of Systems Review of Systems  HENT: Positive for dental problem.   All other systems reviewed and are negative.    Physical Exam Updated Vital Signs BP 128/79 (BP  Location: Right Arm)   Pulse 82   Temp 99.5 F (37.5 C) (Oral)   Resp 16   SpO2 100%   Physical Exam  Constitutional: He appears well-developed and well-nourished. No distress.  NAD.  HENT:  Head: Normocephalic and atraumatic.  Right Ear: External ear normal.  Left Ear: External ear normal.  Nose: Nose normal.  No facial edema. No maxillary or mandibular tenderness. No trismus. MMM. Poor dentition throughout with significant plaque build up. Tooth #31 is cracked in half with black center but no surrounding gingival erythema, edema, warmth, fluctuance, drainage.   Eyes: Conjunctivae and EOM are normal. No scleral icterus.  Neck: Normal range of motion. Neck supple.  Cardiovascular: Normal rate, regular rhythm and normal heart sounds.  Pulmonary/Chest: Effort normal and breath sounds normal.  Musculoskeletal: Normal range of motion. He exhibits no deformity.  Neurological: He is alert.  Skin: Skin is warm and dry. Capillary refill takes less than 2 seconds.  Psychiatric: He has a normal mood and affect. His behavior is normal.  Nursing note and vitals reviewed.    ED Treatments / Results  Labs (all labs ordered are listed, but only abnormal results are displayed) Labs Reviewed - No data to display  EKG None  Radiology No results found.  Procedures Procedures (including critical care time)  Medications Ordered in ED Medications -  No data to display   Initial Impression / Assessment and Plan / ED Course  I have reviewed the triage vital signs and the nursing notes.  Pertinent labs & imaging results that were available during my care of the patient were reviewed by me and considered in my medical decision making (see chart for details).     Dental pain associated with dental caries, cracked teeth, poor overall dental hygiene but no signs or symptoms of dental abscess on exam with patient afebrile, non toxic appearing, swallowing secretions well without hot potato  voice. Exam unconcerning for Ludwig's angina or other deep tissue infection in neck.  As there is no gum swelling, fluctuance, erythema, and facial swelling, will treat not treat with antibiotic.  Will dc with conservative management of dental pain.  Urged patient to follow-up with dentist.  Patient given dentist resource list, encouraged to follow up in 1-2 days for ultimate management of dental pain and overall dental health. Strict ED return precautions given. Pt is aware of red flag symptoms that would warrant return to ED for re-evaluation and further treatment. Patient voices understanding and is agreeable to plan.   Final Clinical Impressions(s) / ED Diagnoses   Final diagnoses:  Pain, dental    ED Discharge Orders        Ordered    lidocaine (XYLOCAINE) 2 % solution  Every 4 hours PRN     05/01/18 0055       Liberty Handy, PA-C 05/01/18 0103    Azalia Bilis, MD 05/01/18 1015

## 2018-05-01 NOTE — Discharge Instructions (Signed)
You will continue to have dental pain until you are evaluated and treated by a dentist.  Call dentist offices to schedule an initial examination.   For pain can take 1000 mg acetaminophen every 6-8 hours.  Over the counter oragel can help numb gumlines.  If you prefer a prescription, can use lidocaine solution every 4 hours and/or before bed time for more pain control.  Rinse mouth/gum line with lidocaine for approx 5 min and spit out.   Return for fevers, facial swelling, gumline swelling or pus.

## 2020-06-18 ENCOUNTER — Emergency Department
Admission: EM | Admit: 2020-06-18 | Discharge: 2020-06-18 | Disposition: A | Discharge status: Home / Self Care | Payer: No Typology Code available for payment source | Attending: Emergency Medicine | Admitting: Emergency Medicine

## 2020-06-18 ENCOUNTER — Emergency Department: Payer: No Typology Code available for payment source

## 2020-06-18 ENCOUNTER — Other Ambulatory Visit: Payer: Self-pay

## 2020-06-18 DIAGNOSIS — Y999 Unspecified external cause status: Secondary | ICD-10-CM | POA: Diagnosis not present

## 2020-06-18 DIAGNOSIS — Z041 Encounter for examination and observation following transport accident: Secondary | ICD-10-CM | POA: Diagnosis not present

## 2020-06-18 DIAGNOSIS — Y92481 Parking lot as the place of occurrence of the external cause: Secondary | ICD-10-CM | POA: Insufficient documentation

## 2020-06-18 DIAGNOSIS — Y939 Activity, unspecified: Secondary | ICD-10-CM | POA: Insufficient documentation

## 2020-06-18 DIAGNOSIS — J45909 Unspecified asthma, uncomplicated: Secondary | ICD-10-CM | POA: Insufficient documentation

## 2020-06-18 DIAGNOSIS — F17210 Nicotine dependence, cigarettes, uncomplicated: Secondary | ICD-10-CM | POA: Insufficient documentation

## 2020-06-18 DIAGNOSIS — S3992XA Unspecified injury of lower back, initial encounter: Secondary | ICD-10-CM | POA: Diagnosis present

## 2020-06-18 MED ORDER — IBUPROFEN 400 MG PO TABS: 400.0000 mg | ORAL_TABLET | Freq: Four times a day (QID) | ORAL | 0 refills | Status: DC | PRN

## 2020-06-18 MED ORDER — LIDOCAINE 5 % EX PTCH: 1.0000 | MEDICATED_PATCH | CUTANEOUS | 0 refills | Status: DC

## 2020-06-18 MED ORDER — CYCLOBENZAPRINE HCL 5 MG PO TABS: ORAL_TABLET | ORAL | 0 refills | Status: DC
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
## 2021-09-05 ENCOUNTER — Emergency Department (HOSPITAL_COMMUNITY): Payer: Self-pay

## 2021-09-05 ENCOUNTER — Other Ambulatory Visit: Payer: Self-pay

## 2021-09-05 ENCOUNTER — Emergency Department (HOSPITAL_COMMUNITY)
Admission: EM | Admit: 2021-09-05 | Discharge: 2021-09-06 | Disposition: A | Discharge status: Home / Self Care | Payer: Self-pay | Attending: Emergency Medicine | Admitting: Emergency Medicine

## 2021-09-05 ENCOUNTER — Encounter (HOSPITAL_COMMUNITY): Payer: Self-pay

## 2021-09-05 DIAGNOSIS — R45851 Suicidal ideations: Secondary | ICD-10-CM | POA: Insufficient documentation

## 2021-09-05 DIAGNOSIS — F17210 Nicotine dependence, cigarettes, uncomplicated: Secondary | ICD-10-CM | POA: Insufficient documentation

## 2021-09-05 DIAGNOSIS — F323 Major depressive disorder, single episode, severe with psychotic features: Secondary | ICD-10-CM | POA: Insufficient documentation

## 2021-09-05 DIAGNOSIS — F1020 Alcohol dependence, uncomplicated: Secondary | ICD-10-CM | POA: Insufficient documentation

## 2021-09-05 DIAGNOSIS — J45909 Unspecified asthma, uncomplicated: Secondary | ICD-10-CM | POA: Insufficient documentation

## 2021-09-05 DIAGNOSIS — F1420 Cocaine dependence, uncomplicated: Secondary | ICD-10-CM | POA: Insufficient documentation

## 2021-09-05 DIAGNOSIS — F1220 Cannabis dependence, uncomplicated: Secondary | ICD-10-CM | POA: Insufficient documentation

## 2021-09-05 DIAGNOSIS — R451 Restlessness and agitation: Secondary | ICD-10-CM | POA: Insufficient documentation

## 2021-09-05 DIAGNOSIS — Z20822 Contact with and (suspected) exposure to covid-19: Secondary | ICD-10-CM | POA: Insufficient documentation

## 2021-09-05 LAB — COMPREHENSIVE METABOLIC PANEL
ALT: 24 U/L (ref 0–44)
AST: 33 U/L (ref 15–41)
Albumin: 4.9 g/dL (ref 3.5–5.0)
Alkaline Phosphatase: 74 U/L (ref 38–126)
Anion gap: 10 (ref 5–15)
BUN: 7 mg/dL (ref 6–20)
CO2: 26 mmol/L (ref 22–32)
Calcium: 9.6 mg/dL (ref 8.9–10.3)
Chloride: 103 mmol/L (ref 98–111)
Creatinine, Ser: 0.92 mg/dL (ref 0.61–1.24)
GFR, Estimated: >60 mL/min (ref >60)
Glucose, Bld: 105 mg/dL — ABNORMAL HIGH (ref 70–99)
Potassium: 3.9 mmol/L (ref 3.5–5.1)
Sodium: 139 mmol/L (ref 135–145)
Total Bilirubin: 0.6 mg/dL (ref 0.3–1.2)
Total Protein: 9.1 g/dL — ABNORMAL HIGH (ref 6.5–8.1)

## 2021-09-05 LAB — CBC WITH DIFFERENTIAL/PLATELET
Abs Immature Granulocytes: 0.08 10*3/uL — ABNORMAL HIGH (ref 0.00–0.07)
Basophils Absolute: 0.1 10*3/uL (ref 0.0–0.1)
Basophils Relative: 0 %
Eosinophils Absolute: 0 10*3/uL (ref 0.0–0.5)
Eosinophils Relative: 0 %
HCT: 47 % (ref 39.0–52.0)
Hemoglobin: 15.6 g/dL (ref 13.0–17.0)
Immature Granulocytes: 1 %
Lymphocytes Relative: 12 %
Lymphs Abs: 2 10*3/uL (ref 0.7–4.0)
MCH: 28.9 pg (ref 26.0–34.0)
MCHC: 33.2 g/dL (ref 30.0–36.0)
MCV: 87.2 fL (ref 80.0–100.0)
Monocytes Absolute: 0.8 10*3/uL (ref 0.1–1.0)
Monocytes Relative: 5 %
Neutro Abs: 13.6 10*3/uL — ABNORMAL HIGH (ref 1.7–7.7)
Neutrophils Relative %: 82 %
Platelets: 229 10*3/uL (ref 150–400)
RBC: 5.39 MIL/uL (ref 4.22–5.81)
RDW: 13.2 % (ref 11.5–15.5)
WBC: 16.6 10*3/uL — ABNORMAL HIGH (ref 4.0–10.5)
nRBC: 0 % (ref 0.0–0.2)

## 2021-09-05 LAB — URINALYSIS, MICROSCOPIC (REFLEX): Bacteria, UA: NONE SEEN

## 2021-09-05 LAB — URINALYSIS, ROUTINE W REFLEX MICROSCOPIC
Bilirubin Urine: NEGATIVE
Glucose, UA: NEGATIVE mg/dL
Ketones, ur: NEGATIVE mg/dL
Leukocytes,Ua: NEGATIVE
Nitrite: NEGATIVE
Protein, ur: NEGATIVE mg/dL
Specific Gravity, Urine: <1.005 — ABNORMAL LOW (ref 1.005–1.030)
pH: 6 (ref 5.0–8.0)

## 2021-09-05 LAB — RESP PANEL BY RT-PCR (FLU A&B, COVID) ARPGX2
Influenza A by PCR: NEGATIVE
Influenza B by PCR: NEGATIVE
SARS Coronavirus 2 by RT PCR: NEGATIVE

## 2021-09-05 LAB — RAPID URINE DRUG SCREEN, HOSP PERFORMED
Amphetamines: NOT DETECTED
Barbiturates: NOT DETECTED
Benzodiazepines: NOT DETECTED
Cocaine: NOT DETECTED
Opiates: NOT DETECTED
Tetrahydrocannabinol: POSITIVE — AB

## 2021-09-05 LAB — ETHANOL: Alcohol, Ethyl (B): 199 mg/dL — ABNORMAL HIGH (ref <10)

## 2021-09-05 LAB — ACETAMINOPHEN LEVEL: Acetaminophen (Tylenol), Serum: <10 ug/mL — ABNORMAL LOW (ref 10–30)

## 2021-09-05 LAB — MAGNESIUM: Magnesium: 2.2 mg/dL (ref 1.7–2.4)

## 2021-09-05 MED ORDER — IBUPROFEN 200 MG PO TABS
600.0000 mg | ORAL_TABLET | Freq: Once | ORAL | Status: AC
  Administered 2021-09-05: 600 mg via ORAL
  Filled 2021-09-05: qty 3

## 2021-09-05 MED ORDER — NICOTINE 21 MG/24HR TD PT24: 21.0000 mg | MEDICATED_PATCH | Freq: Every day | TRANSDERMAL | Status: DC

## 2021-09-05 NOTE — ED Notes (Signed)
Pt keeps trying to leave unit.  Unsafe environment.

## 2021-09-05 NOTE — ED Notes (Signed)
Pt to room 27. Pt alert, nos/s of distress.  Pt oriented to to unit and room.
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
## 2021-09-05 NOTE — ED Notes (Signed)
Report given to Beth Willis, RN.  ?

## 2021-09-05 NOTE — BH Assessment (Signed)
[facility] Assessment Progress Note   Per [name], NP pt is to remain at [facility] for another day for further observation and stabilization.  Pt presents under IVC initiated by pt's sibling and upheld by EDP Tilden Fossa, MD.  EDP Rolan Bucco, MD and pt's nurse, Ladona Ridgel, have been notified.  Doylene Canning, [place] [facility] Coordinator [phone]

## 2021-09-05 NOTE — ED Notes (Signed)
PT resting at this time. NADN.

## 2021-09-05 NOTE — ED Notes (Signed)
Pt refused EKG.
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
## 2021-09-05 NOTE — ED Notes (Signed)
[facility] police leaving bedside. PT resting. NADN.
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
## 2021-09-05 NOTE — BH Assessment (Signed)
TTS spoke to NS, stated that CN will put  pt in a private room to complete TTS assessment.  Clinician to call the cart in three minutes.

## 2021-09-05 NOTE — ED Notes (Signed)
Pt exited his exam room and approached the nurse's station stating, "I need my phone and my clothes and need to get out of here." This nurse explained to the pt that he has been IVC'd and that he would need to please return to his exam room. The pt stated, "I need to get out of here" and continued standing in the hallway. Security was called and escorted the pt back into his exam room. Montel Clock, RN., and ED tech DJ witnessed.
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
## 2021-09-06 MED ORDER — IBUPROFEN 200 MG PO TABS
600.0000 mg | ORAL_TABLET | Freq: Four times a day (QID) | ORAL | Status: DC | PRN
  Administered 2021-09-06: 600 mg via ORAL
  Filled 2021-09-06: qty 3

## 2021-09-06 NOTE — ED Provider Notes (Signed)
Emergency Medicine Observation Re-evaluation Note  [name] is a 35 y.o. male, seen on rounds today.  Pt initially presented to the ED for complaints of IVC Currently, the patient is being observed by psychiatry in the emergency department..  Physical Exam  BP 124/77 (BP Location: Left Arm)   Pulse (!) 57   Temp 98 F (36.7 C) (Oral)   Resp 18   SpO2 99%  Physical Exam General: No acute distress   ED Course / MDM  EKG:EKG Interpretation  Date/Time:  Thursday September 05 2021 08:27:31 EDT Ventricular Rate:  69 PR Interval:  153 QRS Duration: 92 QT Interval:  387 QTC Calculation: 415 R Axis:   78 Text Interpretation: Sinus rhythm Probable left atrial enlargement Abnrm T, consider ischemia, anterolateral lds ST elev, probable normal early repol pattern Confirmed by Blane Ohara [phone]) on 09/06/2021 9:05:38 AM  I have reviewed the labs performed to date as well as medications administered while in observation.  Recent changes in the last 24 hours include patient is complaining of pain, requesting medications.  Previous imaging results were reviewed and there is no acute fracture  Plan  Current plan is for continued psychiatric evaluation. Ibuprofen ordered for pain.  [name] is not under involuntary commitment.     Jack Dibbles, MD 09/06/21 1226

## 2021-09-06 NOTE — Discharge Instructions (Addendum)
If you have any thoughts of hurting yourself or others please utilize the resources listed in this packet or return immediately to emergency department if you have suicidal or homicidal thoughts.  We are here to help.  You may call the suicide prevention Hotline at 1-800-273-8255. 
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
## 2021-09-06 NOTE — ED Notes (Signed)
Unable to obtain e-sign d/t equipment malfunction.  D/c instructions r/w pt including findings, follow up, resources and return precautions.  Pt verbalized understanding of the above. Pt escorted out of ED.

## 2021-09-06 NOTE — ED Provider Notes (Signed)
I was contacted by psychiatry and they have psych cleared the patient for outpatient treatment.  Resources have been attached in the AVS, mom has been contacted for safe discharge.  Vitals are stable, patient is sitting up and well-appearing at time of discharge.  Patient at this time appears safe and stable for discharge and will be treated as an outpatient.  Discharge plan and strict return to ED precautions discussed, patient verbalizes understanding and agreement.   Rozelle Logan, DO 09/06/21 1835
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
## 2023-08-10 ENCOUNTER — Ambulatory Visit (INDEPENDENT_AMBULATORY_CARE_PROVIDER_SITE_OTHER): Payer: Self-pay | Admitting: Licensed Clinical Social Worker

## 2023-08-10 ENCOUNTER — Encounter (HOSPITAL_COMMUNITY): Payer: Self-pay | Admitting: Licensed Clinical Social Worker

## 2023-08-10 DIAGNOSIS — Z008 Encounter for other general examination: Secondary | ICD-10-CM | POA: Insufficient documentation
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
## 2024-08-23 ENCOUNTER — Emergency Department (HOSPITAL_COMMUNITY): Payer: Self-pay | Admitting: Anesthesiology

## 2024-08-23 ENCOUNTER — Other Ambulatory Visit: Payer: Self-pay

## 2024-08-23 ENCOUNTER — Ambulatory Visit (HOSPITAL_COMMUNITY)
Admission: EM | Admit: 2024-08-23 | Discharge: 2024-08-23 | Disposition: A | Discharge status: Home / Self Care | Payer: Self-pay | Attending: Emergency Medicine | Admitting: Emergency Medicine

## 2024-08-23 ENCOUNTER — Encounter (HOSPITAL_COMMUNITY): Payer: Self-pay

## 2024-08-23 ENCOUNTER — Encounter (HOSPITAL_COMMUNITY)
Admission: EM | Disposition: A | Discharge status: Home / Self Care | Payer: Self-pay | Source: Home / Self Care | Attending: Emergency Medicine

## 2024-08-23 ENCOUNTER — Emergency Department (HOSPITAL_COMMUNITY): Payer: Self-pay

## 2024-08-23 DIAGNOSIS — S0181XA Laceration without foreign body of other part of head, initial encounter: Secondary | ICD-10-CM | POA: Diagnosis not present

## 2024-08-23 DIAGNOSIS — S02600A Fracture of unspecified part of body of mandible, initial encounter for closed fracture: Secondary | ICD-10-CM | POA: Diagnosis present

## 2024-08-23 DIAGNOSIS — F17200 Nicotine dependence, unspecified, uncomplicated: Secondary | ICD-10-CM | POA: Insufficient documentation

## 2024-08-23 DIAGNOSIS — S01412A Laceration without foreign body of left cheek and temporomandibular area, initial encounter: Secondary | ICD-10-CM | POA: Diagnosis not present

## 2024-08-23 DIAGNOSIS — S02609A Fracture of mandible, unspecified, initial encounter for closed fracture: Secondary | ICD-10-CM | POA: Diagnosis not present

## 2024-08-23 HISTORY — PX: ORIF MANDIBULAR FRACTURE: SHX2127

## 2024-08-23 LAB — CBC WITH DIFFERENTIAL/PLATELET
Abs Immature Granulocytes: 0.04 K/uL (ref 0.00–0.07)
Basophils Absolute: 0.1 K/uL (ref 0.0–0.1)
Basophils Relative: 1 %
Eosinophils Absolute: 0 K/uL (ref 0.0–0.5)
Eosinophils Relative: 0 %
HCT: 52 % (ref 39.0–52.0)
Hemoglobin: 16.9 g/dL (ref 13.0–17.0)
Immature Granulocytes: 0 %
Lymphocytes Relative: 13 %
Lymphs Abs: 1.5 K/uL (ref 0.7–4.0)
MCH: 29.2 pg (ref 26.0–34.0)
MCHC: 32.5 g/dL (ref 30.0–36.0)
MCV: 90 fL (ref 80.0–100.0)
Monocytes Absolute: 0.8 K/uL (ref 0.1–1.0)
Monocytes Relative: 7 %
Neutro Abs: 9.1 K/uL — ABNORMAL HIGH (ref 1.7–7.7)
Neutrophils Relative %: 79 %
Platelets: 243 K/uL (ref 150–400)
RBC: 5.78 MIL/uL (ref 4.22–5.81)
RDW: 15.9 % — ABNORMAL HIGH (ref 11.5–15.5)
WBC: 11.5 K/uL — ABNORMAL HIGH (ref 4.0–10.5)
nRBC: 0 % (ref 0.0–0.2)

## 2024-08-23 LAB — COMPREHENSIVE METABOLIC PANEL WITH GFR
ALT: 22 U/L (ref 0–44)
AST: 29 U/L (ref 15–41)
Albumin: 3.7 g/dL (ref 3.5–5.0)
Alkaline Phosphatase: 56 U/L (ref 38–126)
Anion gap: 12 (ref 5–15)
BUN: 6 mg/dL (ref 6–20)
CO2: 25 mmol/L (ref 22–32)
Calcium: 9.2 mg/dL (ref 8.9–10.3)
Chloride: 101 mmol/L (ref 98–111)
Creatinine, Ser: 1.14 mg/dL (ref 0.61–1.24)
GFR, Estimated: >60 mL/min (ref >60)
Glucose, Bld: 123 mg/dL — ABNORMAL HIGH (ref 70–99)
Potassium: 4.2 mmol/L (ref 3.5–5.1)
Sodium: 138 mmol/L (ref 135–145)
Total Bilirubin: 1 mg/dL (ref 0.0–1.2)
Total Protein: 6.9 g/dL (ref 6.5–8.1)

## 2024-08-23 SURGERY — OPEN REDUCTION INTERNAL FIXATION (ORIF) MANDIBULAR FRACTURE
Anesthesia: General | Site: Face

## 2024-08-23 MED ORDER — PROPOFOL 10 MG/ML IV BOLUS
INTRAVENOUS | Status: AC
  Filled 2024-08-23: qty 20

## 2024-08-23 MED ORDER — LIDOCAINE-EPINEPHRINE 1 %-1:100000 IJ SOLN
INTRAMUSCULAR | Status: AC
  Filled 2024-08-23: qty 1

## 2024-08-23 MED ORDER — SODIUM CHLORIDE 0.9 % IV SOLN: 12.5000 mg | INTRAVENOUS | Status: DC | PRN

## 2024-08-23 MED ORDER — OXYMETAZOLINE HCL 0.05 % NA SOLN
NASAL | Status: DC | PRN
Start: 2024-08-23 — End: 2024-08-23
  Administered 2024-08-23: 2 via NASAL

## 2024-08-23 MED ORDER — OXYCODONE HCL 5 MG/5ML PO SOLN: 5.0000 mg | Freq: Once | ORAL | Status: DC | PRN

## 2024-08-23 MED ORDER — HYDROCODONE-ACETAMINOPHEN 7.5-325 MG/15ML PO SOLN: 10.0000 mL | Freq: Four times a day (QID) | ORAL | 0 refills | Status: AC | PRN

## 2024-08-23 MED ORDER — MIDAZOLAM HCL 2 MG/2ML IJ SOLN
INTRAMUSCULAR | Status: AC
  Filled 2024-08-23: qty 2

## 2024-08-23 MED ORDER — ACETAMINOPHEN 10 MG/ML IV SOLN
INTRAVENOUS | Status: DC | PRN
  Administered 2024-08-23: 1000 mg via INTRAVENOUS

## 2024-08-23 MED ORDER — ONDANSETRON HCL 4 MG/2ML IJ SOLN
INTRAMUSCULAR | Status: AC
  Filled 2024-08-23: qty 2

## 2024-08-23 MED ORDER — SUCCINYLCHOLINE CHLORIDE 200 MG/10ML IV SOSY
PREFILLED_SYRINGE | INTRAVENOUS | Status: AC
  Filled 2024-08-23: qty 10

## 2024-08-23 MED ORDER — 0.9 % SODIUM CHLORIDE (POUR BTL) OPTIME
TOPICAL | Status: DC | PRN
Start: 2024-08-23 — End: 2024-08-23
  Administered 2024-08-23: 1000 mL

## 2024-08-23 MED ORDER — LACTATED RINGERS IV SOLN: INTRAVENOUS | Status: DC

## 2024-08-23 MED ORDER — FENTANYL CITRATE (PF) 250 MCG/5ML IJ SOLN
INTRAMUSCULAR | Status: AC
  Filled 2024-08-23: qty 5

## 2024-08-23 MED ORDER — ORAL CARE MOUTH RINSE: 15.0000 mL | Freq: Once | OROMUCOSAL | Status: AC

## 2024-08-23 MED ORDER — PROPOFOL 10 MG/ML IV BOLUS
INTRAVENOUS | Status: DC | PRN
  Administered 2024-08-23: 200 mg via INTRAVENOUS

## 2024-08-23 MED ORDER — PHENYLEPHRINE 80 MCG/ML (10ML) SYRINGE FOR IV PUSH (FOR BLOOD PRESSURE SUPPORT)
PREFILLED_SYRINGE | INTRAVENOUS | Status: AC
  Filled 2024-08-23: qty 10

## 2024-08-23 MED ORDER — SUCCINYLCHOLINE CHLORIDE 200 MG/10ML IV SOSY
PREFILLED_SYRINGE | INTRAVENOUS | Status: DC | PRN
  Administered 2024-08-23: 120 mg via INTRAVENOUS

## 2024-08-23 MED ORDER — CHLORHEXIDINE GLUCONATE 0.12 % MT SOLN
15.0000 mL | Freq: Once | OROMUCOSAL | Status: AC
  Administered 2024-08-23: 15 mL via OROMUCOSAL

## 2024-08-23 MED ORDER — ROCURONIUM BROMIDE 10 MG/ML (PF) SYRINGE
PREFILLED_SYRINGE | INTRAVENOUS | Status: DC | PRN
  Administered 2024-08-23: 50 mg via INTRAVENOUS
  Administered 2024-08-23 (×2): 10 mg via INTRAVENOUS

## 2024-08-23 MED ORDER — OXYCODONE HCL 5 MG PO TABS: 5.0000 mg | ORAL_TABLET | Freq: Once | ORAL | Status: DC | PRN

## 2024-08-23 MED ORDER — SODIUM CHLORIDE 0.9 % IV SOLN
3.0000 g | Freq: Once | INTRAVENOUS | Status: AC
  Administered 2024-08-23: 3 g via INTRAVENOUS
  Filled 2024-08-23: qty 8

## 2024-08-23 MED ORDER — SUGAMMADEX SODIUM 200 MG/2ML IV SOLN
INTRAVENOUS | Status: DC | PRN
  Administered 2024-08-23: 200 mg via INTRAVENOUS

## 2024-08-23 MED ORDER — LIDOCAINE 2% (20 MG/ML) 5 ML SYRINGE
INTRAMUSCULAR | Status: DC | PRN
  Administered 2024-08-23: 60 mg via INTRAVENOUS

## 2024-08-23 MED ORDER — LIDOCAINE-EPINEPHRINE-TETRACAINE (LET) TOPICAL GEL
3.0000 mL | Freq: Once | TOPICAL | Status: AC
  Administered 2024-08-23: 3 mL via TOPICAL
  Filled 2024-08-23: qty 3

## 2024-08-23 MED ORDER — ACETAMINOPHEN 10 MG/ML IV SOLN
INTRAVENOUS | Status: AC
  Filled 2024-08-23: qty 100

## 2024-08-23 MED ORDER — DEXAMETHASONE SODIUM PHOSPHATE 10 MG/ML IJ SOLN
INTRAMUSCULAR | Status: AC
  Filled 2024-08-23: qty 1

## 2024-08-23 MED ORDER — DEXMEDETOMIDINE HCL IN NACL 80 MCG/20ML IV SOLN
INTRAVENOUS | Status: DC | PRN
Start: 2024-08-23 — End: 2024-08-23
  Administered 2024-08-23: 8 ug via INTRAVENOUS
  Administered 2024-08-23: 12 ug via INTRAVENOUS

## 2024-08-23 MED ORDER — FENTANYL CITRATE (PF) 250 MCG/5ML IJ SOLN
INTRAMUSCULAR | Status: DC | PRN
  Administered 2024-08-23 (×3): 50 ug via INTRAVENOUS
  Administered 2024-08-23: 100 ug via INTRAVENOUS

## 2024-08-23 MED ORDER — ONDANSETRON HCL 4 MG/2ML IJ SOLN
4.0000 mg | Freq: Once | INTRAMUSCULAR | Status: AC
  Administered 2024-08-23: 4 mg via INTRAVENOUS
  Filled 2024-08-23: qty 2

## 2024-08-23 MED ORDER — DEXAMETHASONE SODIUM PHOSPHATE 10 MG/ML IJ SOLN
INTRAMUSCULAR | Status: DC | PRN
  Administered 2024-08-23: 10 mg via INTRAVENOUS

## 2024-08-23 MED ORDER — ROCURONIUM BROMIDE 10 MG/ML (PF) SYRINGE
PREFILLED_SYRINGE | INTRAVENOUS | Status: AC
  Filled 2024-08-23: qty 10

## 2024-08-23 MED ORDER — OXYMETAZOLINE HCL 0.05 % NA SOLN
NASAL | Status: AC
  Filled 2024-08-23: qty 30

## 2024-08-23 MED ORDER — LIDOCAINE 2% (20 MG/ML) 5 ML SYRINGE
INTRAMUSCULAR | Status: AC
  Filled 2024-08-23: qty 5

## 2024-08-23 MED ORDER — MIDAZOLAM HCL 2 MG/2ML IJ SOLN
INTRAMUSCULAR | Status: DC | PRN
  Administered 2024-08-23: 2 mg via INTRAVENOUS

## 2024-08-23 MED ORDER — CEPHALEXIN 250 MG/5ML PO SUSR: 500.0000 mg | Freq: Three times a day (TID) | ORAL | 0 refills | Status: AC

## 2024-08-23 MED ORDER — AMISULPRIDE (ANTIEMETIC) 5 MG/2ML IV SOLN: 10.0000 mg | Freq: Once | INTRAVENOUS | Status: DC | PRN

## 2024-08-23 MED ORDER — ONDANSETRON HCL 4 MG/2ML IJ SOLN
INTRAMUSCULAR | Status: DC | PRN
  Administered 2024-08-23: 4 mg via INTRAVENOUS

## 2024-08-23 MED ORDER — DEXMEDETOMIDINE HCL IN NACL 80 MCG/20ML IV SOLN
INTRAVENOUS | Status: AC
  Filled 2024-08-23: qty 20

## 2024-08-23 MED ORDER — FENTANYL CITRATE (PF) 100 MCG/2ML IJ SOLN: 25.0000 ug | INTRAMUSCULAR | Status: DC | PRN

## 2024-08-23 MED ORDER — FENTANYL CITRATE PF 50 MCG/ML IJ SOSY
50.0000 ug | PREFILLED_SYRINGE | Freq: Once | INTRAMUSCULAR | Status: AC
  Administered 2024-08-23: 50 ug via INTRAVENOUS
  Filled 2024-08-23: qty 1

## 2024-08-23 SURGICAL SUPPLY — 45 items
BAG COUNTER SPONGE SURGICOUNT (BAG) ×2 IMPLANT
BAR FIX PREFORMED OMNIMAX (Miscellaneous) IMPLANT
BIT DRILL 1.6X115MM (BIT) IMPLANT
BLADE SURG 10 STRL SS (BLADE) IMPLANT
BLADE SURG 15 STRL LF DISP TIS (BLADE) IMPLANT
CANISTER SUCTION 3000ML PPV (SUCTIONS) ×2 IMPLANT
CLEANER TIP ELECTROSURG 2X2 (MISCELLANEOUS) ×2 IMPLANT
COVER SURGICAL LIGHT HANDLE (MISCELLANEOUS) ×4 IMPLANT
DRAPE HALF SHEET 40X57 (DRAPES) IMPLANT
DRIVER SURG ZDRIVE HIGH TORQUE (ORTHOPEDIC DISPOSABLE SUPPLIES) IMPLANT
ELECT COATED BLADE 2.86 ST (ELECTRODE) IMPLANT
ELECT NDL BLADE 2-5/6 (NEEDLE) IMPLANT
ELECT NEEDLE BLADE 2-5/6 (NEEDLE) IMPLANT
ELECTRODE REM PT RTRN 9FT ADLT (ELECTROSURGICAL) ×2 IMPLANT
GLOVE ECLIPSE 7.5 STRL STRAW (GLOVE) ×2 IMPLANT
GOWN STRL REUS W/ TWL LRG LVL3 (GOWN DISPOSABLE) ×4 IMPLANT
KIT BASIN OR (CUSTOM PROCEDURE TRAY) ×2 IMPLANT
KIT TURNOVER KIT B (KITS) ×2 IMPLANT
NDL HYPO 25GX1X1/2 BEV (NEEDLE) IMPLANT
NEEDLE HYPO 25GX1X1/2 BEV (NEEDLE) IMPLANT
NS IRRIG 1000ML POUR BTL (IV SOLUTION) ×2 IMPLANT
PAD ARMBOARD POSITIONER FOAM (MISCELLANEOUS) ×4 IMPLANT
PATTIES SURGICAL .5 X3 (DISPOSABLE) IMPLANT
PENCIL FOOT CONTROL (ELECTRODE) ×2 IMPLANT
PLATE 4HOLE STR 1.6MM (Plate) IMPLANT
PLATE LOCK STRT 4H 1/SCREW (Plate) IMPLANT
POSITIONER HEAD DONUT 9IN (MISCELLANEOUS) IMPLANT
PROTECTOR CORNEAL (OPHTHALMIC RELATED) IMPLANT
SCISSORS WIRE ANG 4 3/4 DISP (INSTRUMENTS) IMPLANT
SCREW BONE 2X7 CROSS DRIVE (Screw) IMPLANT
SCREW BONE MANDIB SD 2X9 (Screw) IMPLANT
SCREW NLOCK LORENZ 2X5 (Screw) IMPLANT
SCREW NON LOCK HT 2.0X16 (Screw) IMPLANT
SCREW NON LOCK X-DR 2.0X14 (Screw) IMPLANT
SPIKE FLUID TRANSFER (MISCELLANEOUS) ×2 IMPLANT
SUT CHROMIC 3 0 PS 2 (SUTURE) ×2 IMPLANT
SUT CHROMIC 3 0 SH 27 (SUTURE) IMPLANT
SUT MNCRL AB 4-0 PS2 18 (SUTURE) ×2 IMPLANT
SUT NYLON ETHILON 5-0 P-3 1X18 (SUTURE) ×2 IMPLANT
SUT SILK 2 0 PERMA HAND 18 BK (SUTURE) IMPLANT
SUT STEEL 0 18XMFL TIE 17 (SUTURE) IMPLANT
SUT STEEL 4 (SUTURE) ×2 IMPLANT
TOWEL GREEN STERILE FF (TOWEL DISPOSABLE) ×2 IMPLANT
TRAY ENT MC OR (CUSTOM PROCEDURE TRAY) ×2 IMPLANT
WATER STERILE IRR 1000ML POUR (IV SOLUTION) ×2 IMPLANT
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
## 2024-08-23 NOTE — Anesthesia Procedure Notes (Signed)
 Procedure Name: Intubation Date/Time: 08/23/2024 6:35 PM  Performed by: Roddie Grate, CRNAPre-anesthesia Checklist: Patient identified, Emergency Drugs available, Suction available, Patient being monitored and Timeout performed Patient Re-evaluated:Patient Re-evaluated prior to induction Oxygen Delivery Method: Circle system utilized Preoxygenation: Pre-oxygenation with 100% oxygen Induction Type: IV induction Ventilation: Mask ventilation without difficulty and Nasal airway inserted- appropriate to patient size Laryngoscope Size: Mac and 4 Grade View: Grade I Nasal Tubes: Right, Nasal prep performed and Nasal Rae Tube size: 7.0 mm Number of attempts: 1 Airway Equipment and Method: Stylet Placement Confirmation: ETT inserted through vocal cords under direct vision, positive ETCO2 and breath sounds checked- equal and bilateral Secured at: 30 cm Tube secured with: Tape Dental Injury: Teeth and Oropharynx as per pre-operative assessment  Comments: Smooth IV Induction. Eyes taped. DL x 1 with grade 1 view. Atraumatically placed, teeth and lip remain intact as pre-op. Secured with tape. Bilateral breath sounds +/=, EtCO2 +, Adequate TV, VSS.

## 2024-08-23 NOTE — Anesthesia Postprocedure Evaluation (Addendum)
 Anesthesia Post Note  Patient: [name]  Procedure(s) Performed: OPEN REDUCTION INTERNAL FIXATION (ORIF) MANDIBULAR FRACTURE (Face)     Patient location during evaluation: PACU Anesthesia Type: General Level of consciousness: awake and alert Pain management: pain level controlled Vital Signs Assessment: post-procedure vital signs reviewed and stable Respiratory status: spontaneous breathing, nonlabored ventilation, respiratory function stable and patient connected to nasal cannula oxygen Cardiovascular status: blood pressure returned to baseline and stable Postop Assessment: no apparent nausea or vomiting Anesthetic complications: no   No notable events documented.  Last Vitals:  Vitals:   08/23/24 2030 08/23/24 2045  BP: (!) 150/81 (!) 144/85  Pulse: 63 66  Resp: 17 17  Temp:  37.3 C  SpO2: 94% 97%    Last Pain:  Vitals:   08/23/24 2010  TempSrc:   PainSc: 0-No pain                 Debby FORBES Like

## 2024-08-23 NOTE — Anesthesia Preprocedure Evaluation (Addendum)
 Anesthesia Evaluation  Patient identified by MRN, date of birth, ID band Patient awake    Reviewed: Allergy & Precautions, NPO status , Patient's Chart, lab work & pertinent test results  History of Anesthesia Complications Negative for: history of anesthetic complications  Airway Mallampati: Unable to assess  TM Distance: >3 FB Neck ROM: Full  Mouth opening: Limited Mouth Opening  Dental  (+) Dental Advisory Given, Loose   Pulmonary asthma , Current Smoker and Patient abstained from smoking.   Pulmonary exam normal        Cardiovascular negative cardio ROS Normal cardiovascular exam     Neuro/Psych negative neurological ROS  negative psych ROS   GI/Hepatic negative GI ROS, Neg liver ROS,,,  Endo/Other  negative endocrine ROS    Renal/GU negative Renal ROS     Musculoskeletal negative musculoskeletal ROS (+)    Abdominal   Peds  Hematology negative hematology ROS (+)   Anesthesia Other Findings   Reproductive/Obstetrics                              Anesthesia Physical Anesthesia Plan  ASA: 2  Anesthesia Plan: General   Post-op Pain Management: Ofirmev  IV (intra-op)*   Induction: Intravenous  PONV Risk Score and Plan: 1 and Treatment may vary due to age or medical condition, Ondansetron , Dexamethasone  and Midazolam   Airway Management Planned: Nasal ETT  Additional Equipment: None  Intra-op Plan:   Post-operative Plan: Extubation in OR  Informed Consent: I have reviewed the patients History and Physical, chart, labs and discussed the procedure including the risks, benefits and alternatives for the proposed anesthesia with the patient or authorized representative who has indicated his/her understanding and acceptance.     Dental advisory given  Plan Discussed with: CRNA and Anesthesiologist  Anesthesia Plan Comments:          Anesthesia Quick Evaluation
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
## 2024-08-23 NOTE — Transfer of Care (Signed)
 Immediate Anesthesia Transfer of Care Note  Patient: [name]  Procedure(s) Performed: OPEN REDUCTION INTERNAL FIXATION (ORIF) MANDIBULAR FRACTURE (Face)  Patient Location: PACU  Anesthesia Type:General  Level of Consciousness: drowsy  Airway & Oxygen Therapy: Patient Spontanous Breathing and Patient connected to nasal cannula oxygen  Post-op Assessment: Report given to RN and Post -op Vital signs reviewed and stable  Post vital signs: Reviewed and stable  Last Vitals:  Vitals Value Taken Time  BP 151/83 08/23/24 20:15  Temp 37.3 C 08/23/24 20:10  Pulse 61 08/23/24 20:16  Resp 16 08/23/24 20:16  SpO2 100 % 08/23/24 20:16  Vitals shown include unfiled device data.  Last Pain:  Vitals:   08/23/24 2010  TempSrc:   PainSc: 0-No pain         Complications: No notable events documented.

## 2024-08-23 NOTE — ED Notes (Signed)
 ENT Provider at bedside.
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
## 2024-08-23 NOTE — ED Notes (Signed)
 PACU called for report given to [facility] RN, pt going to  bay 24

## 2024-08-24 ENCOUNTER — Encounter (HOSPITAL_COMMUNITY): Payer: Self-pay | Admitting: Otolaryngology

## 2024-09-12 ENCOUNTER — Encounter (INDEPENDENT_AMBULATORY_CARE_PROVIDER_SITE_OTHER): Payer: Self-pay | Admitting: Physician Assistant

## 2024-09-12 ENCOUNTER — Ambulatory Visit (INDEPENDENT_AMBULATORY_CARE_PROVIDER_SITE_OTHER): Payer: Self-pay | Admitting: Physician Assistant

## 2024-09-12 VITALS — BP 118/87 | HR 79 | Ht 72.0 in | Wt 175.4 lb

## 2024-09-12 DIAGNOSIS — Z9889 Other specified postprocedural states: Secondary | ICD-10-CM

## 2024-09-12 DIAGNOSIS — S0269XD Fracture of mandible of other specified site, subsequent encounter for fracture with routine healing: Secondary | ICD-10-CM

## 2024-09-12 NOTE — Progress Notes (Unsigned)
 Dear Dr. Rexford ref. provider found, Here is my assessment for our mutual patient, [name]. Thank you for allowing me the opportunity to care for your patient. Please do not hesitate to contact me should you have any other questions. Sincerely, Chyrl Cohen PA-C  Otolaryngology Clinic Note Referring provider: Dr. Rexford ref. provider found HPI:  [name] is a 38 y.o. male kindly referred by Dr. No ref. provider found   The patient is a 38 year old gentleman seen in our office for follow-up evaluation status post open reduction internal fixation of mandibular fracture by Dr. Roark on 08/23/2024.  The patient notes since that time he has had improvement in the symptoms.  He notes his pain has significantly improved, no longer having the pain at the right jaw.  He has been able to maintain p.o. intake but does want the wires off so he can eat.  He notes some malocclusion on the right side.  He denies any swelling, no fever, no drainage.   Independent Review of Additional Tests or Records:  Op note on 08/23/2024   PMH/Meds/All/SocHx/FamHx/ROS:   Past Medical History:  Diagnosis Date   Asthma      Past Surgical History:  Procedure Laterality Date   ORIF MANDIBULAR FRACTURE N/A 08/23/2024   Procedure: OPEN REDUCTION INTERNAL FIXATION (ORIF) MANDIBULAR FRACTURE;  Surgeon: Roark Rush, MD;  Location: [facility] OR;  Service: ENT;  Laterality: N/A;    No family history on file.   Social Connections: Socially Isolated (08/10/2023)   Social Connection and Isolation Panel    Frequency of Communication with Friends and Family: More than three times a week    Frequency of Social Gatherings with Friends and Family: Once a week    Attends Religious Services: Never    Database administrator or Organizations: No    Attends Banker Meetings: Never    Marital Status: Never married     No current outpatient medications on file.   Physical Exam:   There were no vitals taken for this  visit.  Pertinent Findings  CN II-XII intact MMF brackets in place, screws are tight, wires tight, gingivolabial sulcus incision intact with no dehiscence, no swelling, no pain with palpation of the jaw No obviously palpable neck masses/lymphadenopathy/thyromegaly No respiratory distress or stridor  Seprately Identifiable Procedures:  None  Impression & Plans:  [name] is a 38 y.o. male with the following   Mandibular fracture-  Approximate 3 weeks status post open reduction internal fixation of midline mandibular fracture as well as a right coronoid subcondylar region fracture.  Overall doing well no signs of infection, no significant pain.  He does have some minimal malocclusion on the right.  Given the fractures I would recommend continued MMF for another week.  Would like to see him back in the office at that time, if he is without pain or any complicating issues we will consider removing the wires and placing him in bands.  Otherwise I like him to reach out immediately if he develops any new or worsening signs or symptoms.  He verbalized understanding and agreement to today's plan.   - f/u 1 week   Thank you for allowing me the opportunity to care for your patient. Please do not hesitate to contact me should you have any other questions.  Sincerely, Chyrl Cohen PA-C [facility] Phone: [phone] Fax: [phone]  09/12/2024, 2:03 PM

## 2024-09-20 ENCOUNTER — Ambulatory Visit (INDEPENDENT_AMBULATORY_CARE_PROVIDER_SITE_OTHER): Payer: Self-pay | Admitting: Physician Assistant

## 2024-09-20 NOTE — Progress Notes (Deleted)
 Dear Dr. Rexford ref. provider found, Here is my assessment for our mutual patient, [name]. Thank you for allowing me the opportunity to care for your patient. Please do not hesitate to contact me should you have any other questions. Sincerely, Chyrl Cohen PA-C  Otolaryngology Clinic Note Referring provider: Dr. Rexford ref. provider found HPI:  [name] is a 38 y.o. male kindly referred by Dr. No ref. provider found     The patient is a 38 year old gentleman seen in our office for follow-up evaluation status post open reduction internal fixation of mandibular fracture by Dr. Roark on 08/23/2024.   The patient was last seen in the office on 09/12/2024, below is a recap of that encounter.  The patient notes since that time he has had improvement in the symptoms. He notes his pain has significantly improved, no longer having the pain at the right jaw. He has been able to maintain p.o. intake but does want the wires off so he can eat. He notes some malocclusion on the right side. He denies any swelling, no fever, no drainage.   Update 09/20/2024      Independent Review of Additional Tests or Records:  ***   PMH/Meds/All/SocHx/FamHx/ROS:   Past Medical History:  Diagnosis Date   Asthma      Past Surgical History:  Procedure Laterality Date   ORIF MANDIBULAR FRACTURE N/A 08/23/2024   Procedure: OPEN REDUCTION INTERNAL FIXATION (ORIF) MANDIBULAR FRACTURE;  Surgeon: Roark Rush, MD;  Location: [facility] OR;  Service: ENT;  Laterality: N/A;    No family history on file.   Social Connections: Socially Isolated (08/10/2023)   Social Connection and Isolation Panel    Frequency of Communication with Friends and Family: More than three times a week    Frequency of Social Gatherings with Friends and Family: Once a week    Attends Religious Services: Never    Database administrator or Organizations: No    Attends Banker Meetings: Never    Marital Status: Never married       Current Outpatient Medications:    cephALEXin  (KEFLEX ) 250 MG capsule, Take by mouth 3 (three) times daily., Disp: , Rfl:    HYDROcodone -acetaminophen  (NORCO) 7.5-325 MG tablet, Take 1 tablet by mouth every 6 (six) hours as needed for moderate pain (pain score 4-6)., Disp: , Rfl:    Physical Exam:   There were no vitals taken for this visit.  Pertinent Findings  CN II-XII intact ***Bilateral EAC clear and TM intact with well pneumatized middle ear spaces Weber 512: equal Rinne 512: AC > BC b/l  Anterior rhinoscopy: Septum ***; bilateral inferior turbinates with *** No lesions of oral cavity/oropharynx; dentition *** No obviously palpable neck masses/lymphadenopathy/thyromegaly No respiratory distress or stridor  Seprately Identifiable Procedures:  None***  Impression & Plans:  [name] is a 38 y.o. male with the following   ***   - f/u ***   Thank you for allowing me the opportunity to care for your patient. Please do not hesitate to contact me should you have any other questions.  Sincerely, Chyrl Cohen PA-C [facility] Phone: [phone] Fax: [phone]  09/20/2024, 2:31 PM

## 2024-09-24 ENCOUNTER — Emergency Department (HOSPITAL_COMMUNITY)
Admission: EM | Admit: 2024-09-24 | Discharge: 2024-09-24 | Disposition: A | Discharge status: Home / Self Care | Payer: Self-pay | Attending: Emergency Medicine | Admitting: Emergency Medicine

## 2024-09-24 ENCOUNTER — Other Ambulatory Visit: Payer: Self-pay

## 2024-09-24 ENCOUNTER — Encounter (HOSPITAL_COMMUNITY): Payer: Self-pay

## 2024-09-24 ENCOUNTER — Emergency Department (HOSPITAL_COMMUNITY): Payer: Self-pay

## 2024-09-24 DIAGNOSIS — R1013 Epigastric pain: Secondary | ICD-10-CM | POA: Insufficient documentation

## 2024-09-24 DIAGNOSIS — K920 Hematemesis: Secondary | ICD-10-CM | POA: Diagnosis present

## 2024-09-24 LAB — CBC
HCT: 52.7 % — ABNORMAL HIGH (ref 39.0–52.0)
Hemoglobin: 17.9 g/dL — ABNORMAL HIGH (ref 13.0–17.0)
MCH: 28.9 pg (ref 26.0–34.0)
MCHC: 34 g/dL (ref 30.0–36.0)
MCV: 85.1 fL (ref 80.0–100.0)
Platelets: 230 K/uL (ref 150–400)
RBC: 6.19 MIL/uL — ABNORMAL HIGH (ref 4.22–5.81)
RDW: 13.2 % (ref 11.5–15.5)
WBC: 7.1 K/uL (ref 4.0–10.5)
nRBC: 0 % (ref 0.0–0.2)

## 2024-09-24 LAB — COMPREHENSIVE METABOLIC PANEL WITH GFR
ALT: 19 U/L (ref 0–44)
AST: 31 U/L (ref 15–41)
Albumin: 4.6 g/dL (ref 3.5–5.0)
Alkaline Phosphatase: 46 U/L (ref 38–126)
Anion gap: 20 — ABNORMAL HIGH (ref 5–15)
BUN: 18 mg/dL (ref 6–20)
CO2: 22 mmol/L (ref 22–32)
Calcium: 10.2 mg/dL (ref 8.9–10.3)
Chloride: 94 mmol/L — ABNORMAL LOW (ref 98–111)
Creatinine, Ser: 1.25 mg/dL — ABNORMAL HIGH (ref 0.61–1.24)
GFR, Estimated: >60 mL/min (ref >60)
Glucose, Bld: 79 mg/dL (ref 70–99)
Potassium: 4.2 mmol/L (ref 3.5–5.1)
Sodium: 136 mmol/L (ref 135–145)
Total Bilirubin: 2.1 mg/dL — ABNORMAL HIGH (ref 0.0–1.2)
Total Protein: 7.8 g/dL (ref 6.5–8.1)

## 2024-09-24 LAB — LIPASE, BLOOD: Lipase: 32 U/L (ref 11–51)

## 2024-09-24 LAB — BASIC METABOLIC PANEL WITH GFR
Anion gap: 14 (ref 5–15)
BUN: 15 mg/dL (ref 6–20)
CO2: 23 mmol/L (ref 22–32)
Calcium: 8.9 mg/dL (ref 8.9–10.3)
Chloride: 97 mmol/L — ABNORMAL LOW (ref 98–111)
Creatinine, Ser: 0.44 mg/dL — ABNORMAL LOW (ref 0.61–1.24)
GFR, Estimated: >60 mL/min (ref >60)
Glucose, Bld: 73 mg/dL (ref 70–99)
Potassium: 3.7 mmol/L (ref 3.5–5.1)
Sodium: 134 mmol/L — ABNORMAL LOW (ref 135–145)

## 2024-09-24 MED ORDER — SUCRALFATE 1 G PO TABS
1.0000 g | ORAL_TABLET | Freq: Once | ORAL | Status: DC
  Filled 2024-09-24: qty 1

## 2024-09-24 MED ORDER — MAALOX MAX 400-400-40 MG/5ML PO SUSP: 10.0000 mL | Freq: Four times a day (QID) | ORAL | 0 refills | Status: AC | PRN

## 2024-09-24 MED ORDER — FAMOTIDINE IN NACL 20-0.9 MG/50ML-% IV SOLN
20.0000 mg | Freq: Once | INTRAVENOUS | Status: AC
  Administered 2024-09-24: 20 mg via INTRAVENOUS
  Filled 2024-09-24: qty 50

## 2024-09-24 MED ORDER — ONDANSETRON 4 MG PO TBDP: 4.0000 mg | ORAL_TABLET | Freq: Three times a day (TID) | ORAL | 0 refills | Status: DC | PRN

## 2024-09-24 MED ORDER — SODIUM CHLORIDE 0.9 % IV BOLUS
1000.0000 mL | Freq: Once | INTRAVENOUS | Status: AC
  Administered 2024-09-24: 1000 mL via INTRAVENOUS

## 2024-09-24 MED ORDER — IOHEXOL 350 MG/ML SOLN
75.0000 mL | Freq: Once | INTRAVENOUS | Status: AC | PRN
  Administered 2024-09-24: 75 mL via INTRAVENOUS

## 2024-09-24 MED ORDER — DROPERIDOL 2.5 MG/ML IJ SOLN
1.2500 mg | Freq: Once | INTRAMUSCULAR | Status: AC
Start: 2024-09-24 — End: 2024-09-24
  Administered 2024-09-24: 1.25 mg via INTRAVENOUS
  Filled 2024-09-24: qty 2

## 2024-09-24 MED ORDER — SUCRALFATE 1 GM/10ML PO SUSP
1.0000 g | Freq: Three times a day (TID) | ORAL | Status: DC
  Administered 2024-09-24: 1 g via ORAL
  Filled 2024-09-24: qty 10

## 2024-09-24 NOTE — ED Notes (Signed)
 Pt given urinal and informed of needing a urine sample or we would have to use an in and out cath. Pt refused in and out and stated he would pee when he needs to.

## 2024-09-24 NOTE — ED Provider Notes (Signed)
 [place] EMERGENCY DEPARTMENT AT [facility] Provider Note   CSN: [id number] Arrival date & time: 09/24/24  9262     Patient presents with: Hematemesis   [name] is a 38 y.o. male presented to ED with nausea, vomiting and epigastric pain.  Patient reports been ongoing for about 4 days.  He says he cannot keep down any fluids or food.  He says he is puking up the lining of my stomach with blood.  He reports last bowel movement was 2 or 3 days ago, says he is not passing gas since then.  He denies history of abdominal surgery.   HPI     Prior to Admission medications   Medication Sig Start Date End Date Taking? Authorizing Provider  alum & mag hydroxide-simeth (MAALOX MAX) 400-400-40 MG/5ML suspension Take 10 mLs by mouth every 6 (six) hours as needed for up to 5 days for indigestion. 09/24/24 09/29/24 Yes Alonnie Bieker, Donnice PARAS, MD  ondansetron  (ZOFRAN -ODT) 4 MG disintegrating tablet Take 1 tablet (4 mg total) by mouth every 8 (eight) hours as needed for nausea or vomiting. 09/24/24  Yes Jhovany Weidinger, Donnice PARAS, MD  cephALEXin  (KEFLEX ) 250 MG capsule Take by mouth 3 (three) times daily.    [provider]  HYDROcodone -acetaminophen  (NORCO) 7.5-325 MG tablet Take 1 tablet by mouth every 6 (six) hours as needed for moderate pain (pain score 4-6).    [provider]    Allergies: Strawberry extract    Review of Systems  Updated Vital Signs BP 129/84   Pulse (!) 56   Temp 98.2 F (36.8 C) (Oral)   Resp 18   SpO2 98%   Physical Exam Constitutional:      General: He is not in acute distress.    Comments: Jaws wired shut  HENT:     Head: Normocephalic and atraumatic.  Eyes:     Conjunctiva/sclera: Conjunctivae normal.     Pupils: Pupils are equal, round, and reactive to light.  Cardiovascular:     Rate and Rhythm: Normal rate and regular rhythm.  Pulmonary:     Effort: Pulmonary effort is normal. No respiratory distress.  Abdominal:      General: There is no distension.     Tenderness: There is abdominal tenderness.  Skin:    General: Skin is warm and dry.  Neurological:     General: No focal deficit present.     Mental Status: He is alert. Mental status is at baseline.  Psychiatric:        Mood and Affect: Mood normal.        Behavior: Behavior normal.     (all labs ordered are listed, but only abnormal results are displayed) Labs Reviewed  COMPREHENSIVE METABOLIC PANEL WITH GFR - Abnormal; Notable for the following components:      Result Value   Chloride 94 (*)    Creatinine, Ser 1.25 (*)    Total Bilirubin 2.1 (*)    Anion gap 20 (*)    All other components within normal limits  CBC - Abnormal; Notable for the following components:   RBC 6.19 (*)    Hemoglobin 17.9 (*)    HCT 52.7 (*)    All other components within normal limits  BASIC METABOLIC PANEL WITH GFR - Abnormal; Notable for the following components:   Sodium 134 (*)    Chloride 97 (*)    Creatinine, Ser 0.44 (*)    All other components within normal limits  LIPASE, BLOOD  RAPID URINE DRUG SCREEN, HOSP PERFORMED    EKG: None  Radiology: CT ABDOMEN PELVIS W CONTRAST Result Date: 09/24/2024 CLINICAL DATA:  Bowel obstruction suspected n/v no BM or flatus x 2 days, no hx of abd surgeries EXAM: CT ABDOMEN AND PELVIS WITH CONTRAST TECHNIQUE: Multidetector CT imaging of the abdomen and pelvis was performed using the standard protocol following bolus administration of intravenous contrast. RADIATION DOSE REDUCTION: This exam was performed according to the departmental dose-optimization program which includes automated exposure control, adjustment of the mA and/or kV according to patient size and/or use of iterative reconstruction technique. CONTRAST:  75mL OMNIPAQUE IOHEXOL 350 MG/ML SOLN COMPARISON:  None available. FINDINGS: Lower chest: No focal airspace consolidation or pleural effusion. Hepatobiliary: No mass.No radiopaque stones or wall thickening  of the gallbladder. No intrahepatic or extrahepatic biliary ductal dilation. The portal veins are patent. Pancreas: No mass or main ductal dilation. No peripancreatic inflammation or fluid collection. Spleen: Normal size. No mass. Adrenals/Urinary Tract: No adrenal masses. No renal mass. No nephrolithiasis or hydronephrosis. The urinary bladder is completely decompressed. Stomach/Bowel: The stomach is decompressed without focal abnormality. No small bowel wall thickening or inflammation. No small bowel obstruction.Normal appendix. Vascular/Lymphatic: No aortic aneurysm. No intraabdominal or pelvic lymphadenopathy. Reproductive: No prostatomegaly.No free pelvic fluid. Other: No pneumoperitoneum, ascites, or mesenteric inflammation. Musculoskeletal: No acute fracture or destructive lesion. IMPRESSION: No acute intra-abdominal or pelvic abnormality. Electronically Signed   By: Rogelia Myers M.D.   On: 09/24/2024 11:10     Procedures   Medications Ordered in the ED  sucralfate (CARAFATE) 1 GM/10ML suspension 1 g (1 g Oral Given 09/24/24 1344)  sodium chloride  0.9 % bolus 1,000 mL (0 mLs Intravenous Stopped 09/24/24 1014)  famotidine (PEPCID) IVPB 20 mg premix (0 mg Intravenous Stopped 09/24/24 0942)  droperidol (INAPSINE) 2.5 MG/ML injection 1.25 mg (1.25 mg Intravenous Given 09/24/24 0910)  iohexol (OMNIPAQUE) 350 MG/ML injection 75 mL (75 mLs Intravenous Contrast Given 09/24/24 1043)    Clinical Course as of 09/24/24 1502  Sat Sep 24, 2024  1322 Patient is feeling much better and able to tolerate p.o.  Anticipate discharge.  Repeat BMP shows anion gap closed [MT]  1419 Pt denies THC use or alcohol binging [MT]    Clinical Course User Index [MT] Chavonne Sforza, Donnice PARAS, MD                                 Medical Decision Making Amount and/or Complexity of Data Reviewed Labs: ordered. Radiology: ordered.  Risk OTC drugs. Prescription drug management.   This patient presents to the ED with  concern for nausea, vomiting, hematemesis, epigastric pain. This involves an extensive number of treatment options, and is a complaint that carries with it a high risk of complications and morbidity.  The differential diagnosis includes gastritis versus gastroparesis versus CHS versus pancreatitis versus biliary disease versus bowel obstruction versus other  He may have gastritis bleeding or Mallory-Weiss tear causing the hematemesis with a forceful retching.  Doubt Boerhaave syndrome.  No crepitus or fever on arrival   I ordered and personally interpreted labs.  The pertinent results include: Elevated anion gap, likely from vomiting and dehydration.  White blood cell count within normal limits.  Creatinine 1.25.  I ordered imaging studies including CT abdomen pelvis I independently visualized and interpreted imaging which showed no emergent findings I agree with the radiologist interpretation  I ordered medication including IV fluids, IV  Pepcid and droperidol for suspected gastritis, dehydration from vomiting, and nausea  I have reviewed the patients home medicines and have made adjustments as needed  Test Considered: No indication for thoracic CT imaging at this time or x-ray.  Doubt aspiration pneumonia.  After the interventions noted above, I reevaluated the patient and found that they have: improved   Disposition:  After consideration of the diagnostic results and the patients response to treatment, I feel that the patent would benefit from close outpatient follow-up.      Final diagnoses:  Hematemesis, unspecified whether nausea present    ED Discharge Orders          Ordered    ondansetron  (ZOFRAN -ODT) 4 MG disintegrating tablet  Every 8 hours PRN        09/24/24 1419    alum & mag hydroxide-simeth (MAALOX MAX) 400-400-40 MG/5ML suspension  Every 6 hours PRN        09/24/24 1419               Cottie Donnice PARAS, MD 09/24/24 1502

## 2024-09-24 NOTE — ED Triage Notes (Signed)
 Patient BIB EMS from home c/o vomiting blood for 3 days. Patient rating pain 6/10 in the abdomen. Patient had one episode of emesis with EMS and EMS states there was no blood seen. Patient has had limited intake in food and liquids due to jaw being wired shut. Patient does have jaw wired shut currently and it has been wired for 4 weeks. Patient is supposed to get jaw unwired this Tuesday.

## 2024-09-24 NOTE — Discharge Instructions (Addendum)
 Please stick to a bland diet, avoiding spicy food and very hot food and beverages, as well as orange juice and acidic juices.  Stick to water, bland food.  You can take the Maalox liquid as needed for upset stomach every 6 hours.  I also prescribed you Zofran  dissolving tablets which she can use for nausea.

## 2024-09-27 ENCOUNTER — Encounter (INDEPENDENT_AMBULATORY_CARE_PROVIDER_SITE_OTHER): Payer: Self-pay | Admitting: Physician Assistant

## 2024-09-27 ENCOUNTER — Ambulatory Visit (INDEPENDENT_AMBULATORY_CARE_PROVIDER_SITE_OTHER): Payer: Self-pay | Admitting: Physician Assistant

## 2024-09-27 VITALS — BP 133/83 | HR 85

## 2024-09-27 DIAGNOSIS — S0269XD Fracture of mandible of other specified site, subsequent encounter for fracture with routine healing: Secondary | ICD-10-CM

## 2024-09-27 DIAGNOSIS — Z9889 Other specified postprocedural states: Secondary | ICD-10-CM

## 2024-09-27 NOTE — Progress Notes (Signed)
 Dear Dr. Rexford ref. provider found, Here is my assessment for our mutual patient, [name]. Thank you for allowing me the opportunity to care for your patient. Please do not hesitate to contact me should you have any other questions. Sincerely, Jack Cohen PA-C  Otolaryngology Clinic Note Referring provider: Dr. Rexford ref. provider found HPI:  [name] is a 38 y.o. male kindly referred by Dr. No ref. provider found   The patient is a 38 year old gentleman seen in our office for follow-up evaluation status post open reduction internal fixation of mandibular fracture by Dr. Roark on 08/23/2024.    The patient was last seen in the office on 09/12/2024 below is a recap of that encounter   The patient notes since that time he has had improvement in the symptoms.  He notes his pain has significantly improved, no longer having the pain at the right jaw.  He has been able to maintain p.o. intake but does want the wires off so he can eat.  He notes some malocclusion on the right side.  He denies any swelling, no fever, no drainage.   Update 09/27/2024-  Since last office visit he denies any significant complaints.  He denies any swelling, no significant pain of the jaw.  He does note that he has had episodes of nausea and vomiting.  Does like his teeth are somewhat aligned but has difficulty determining at this point.   Independent Review of Additional Tests or Records:  None  PMH/Meds/All/SocHx/FamHx/ROS:   Past Medical History:  Diagnosis Date   Asthma      Past Surgical History:  Procedure Laterality Date   ORIF MANDIBULAR FRACTURE N/A 08/23/2024   Procedure: OPEN REDUCTION INTERNAL FIXATION (ORIF) MANDIBULAR FRACTURE;  Surgeon: Jack Rush, MD;  Location: [facility] OR;  Service: ENT;  Laterality: N/A;    No family history on file.   Social Connections: Socially Isolated (08/10/2023)   Social Connection and Isolation Panel    Frequency of Communication with Friends and Family: More  than three times a week    Frequency of Social Gatherings with Friends and Family: Once a week    Attends Religious Services: Never    Database administrator or Organizations: No    Attends Banker Meetings: Never    Marital Status: Never married      Current Outpatient Medications:    alum & mag hydroxide-simeth (MAALOX MAX) 400-400-40 MG/5ML suspension, Take 10 mLs by mouth every 6 (six) hours as needed for up to 5 days for indigestion., Disp: 355 mL, Rfl: 0   cephALEXin  (KEFLEX ) 250 MG capsule, Take by mouth 3 (three) times daily., Disp: , Rfl:    HYDROcodone -acetaminophen  (NORCO) 7.5-325 MG tablet, Take 1 tablet by mouth every 6 (six) hours as needed for moderate pain (pain score 4-6)., Disp: , Rfl:    ondansetron  (ZOFRAN -ODT) 4 MG disintegrating tablet, Take 1 tablet (4 mg total) by mouth every 8 (eight) hours as needed for nausea or vomiting., Disp: 20 tablet, Rfl: 0   Physical Exam:   BP 133/83 (BP Location: Left Arm, Patient Position: Sitting)   Pulse 85   SpO2 96%   Pertinent Findings  CN II-XII intact MMF brackets in place, screws are tight, wires tight, gingivolabial sulcus incision intact with no dehiscence, no swelling, no pain with palpation of the jaw No obviously palpable neck masses/lymphadenopathy/thyromegaly No respiratory distress or stridor  Seprately Identifiable Procedures:  None  Impression & Plans:  [name] is a 38  y.o. male with the following   5 weeks status post 1 reduction internal fixation of midline manipulate fracture as well as right coronoid subcondylar region fracture with MMF by Dr. Roark.  Patient doing well today, no complicating issues.  No pain, does not describe any malocclusion but is somewhat vague.  Given the duration and current symptoms I did remove the wires.  He was placed in rubber bands, and like to see him back next week for removal of hardware.  He we will stick to a very soft diet, if he has any pain he will  switch back to liquids and reach out to the office.  Strict return precautions given.   - f/u 1 week   Thank you for allowing me the opportunity to care for your patient. Please do not hesitate to contact me should you have any other questions.  Sincerely, Jack Cohen PA-C [facility] Phone: [phone] Fax: [phone]  09/27/2024, 3:11 PM

## 2024-09-28 ENCOUNTER — Telehealth (INDEPENDENT_AMBULATORY_CARE_PROVIDER_SITE_OTHER): Payer: Self-pay

## 2024-09-28 NOTE — Telephone Encounter (Signed)
 Patient called to let Chyrl know that he does not have his prescription at the pharmacy for the mouthwash.

## 2024-09-29 MED ORDER — CHLORHEXIDINE GLUCONATE 0.12 % MT SOLN: 15.0000 mL | Freq: Two times a day (BID) | OROMUCOSAL | 0 refills | Status: DC

## 2024-09-29 NOTE — Addendum Note (Signed)
 Addended byBETHA COHEN, Ewing Fandino on: 09/29/2024 08:27 AM   Modules accepted: Orders

## 2024-10-06 ENCOUNTER — Encounter (INDEPENDENT_AMBULATORY_CARE_PROVIDER_SITE_OTHER): Payer: Self-pay | Admitting: Physician Assistant

## 2024-10-07 ENCOUNTER — Encounter (INDEPENDENT_AMBULATORY_CARE_PROVIDER_SITE_OTHER): Payer: Self-pay | Admitting: Physician Assistant

## 2024-10-08 ENCOUNTER — Emergency Department (HOSPITAL_COMMUNITY): Payer: Self-pay

## 2024-10-08 ENCOUNTER — Other Ambulatory Visit: Payer: Self-pay

## 2024-10-08 ENCOUNTER — Encounter (HOSPITAL_COMMUNITY): Payer: Self-pay | Admitting: Emergency Medicine

## 2024-10-08 ENCOUNTER — Inpatient Hospital Stay (HOSPITAL_COMMUNITY)
Admission: EM | Admit: 2024-10-08 | Discharge: 2024-10-12 | DRG: 641 | Disposition: A | Discharge status: Home / Self Care | Payer: MEDICAID | Attending: Internal Medicine | Admitting: Internal Medicine

## 2024-10-08 DIAGNOSIS — E639 Nutritional deficiency, unspecified: Secondary | ICD-10-CM | POA: Diagnosis present

## 2024-10-08 DIAGNOSIS — E860 Dehydration: Secondary | ICD-10-CM | POA: Diagnosis present

## 2024-10-08 DIAGNOSIS — Z87891 Personal history of nicotine dependence: Secondary | ICD-10-CM

## 2024-10-08 DIAGNOSIS — S02609D Fracture of mandible, unspecified, subsequent encounter for fracture with routine healing: Secondary | ICD-10-CM

## 2024-10-08 DIAGNOSIS — F1010 Alcohol abuse, uncomplicated: Secondary | ICD-10-CM | POA: Diagnosis present

## 2024-10-08 DIAGNOSIS — E512 Wernicke's encephalopathy: Principal | ICD-10-CM | POA: Diagnosis present

## 2024-10-08 DIAGNOSIS — H5123 Internuclear ophthalmoplegia, bilateral: Principal | ICD-10-CM | POA: Diagnosis present

## 2024-10-08 DIAGNOSIS — J45909 Unspecified asthma, uncomplicated: Secondary | ICD-10-CM | POA: Diagnosis present

## 2024-10-08 DIAGNOSIS — Z23 Encounter for immunization: Secondary | ICD-10-CM

## 2024-10-08 DIAGNOSIS — H532 Diplopia: Secondary | ICD-10-CM | POA: Diagnosis present

## 2024-10-08 DIAGNOSIS — R112 Nausea with vomiting, unspecified: Secondary | ICD-10-CM | POA: Diagnosis present

## 2024-10-08 DIAGNOSIS — R270 Ataxia, unspecified: Secondary | ICD-10-CM

## 2024-10-08 DIAGNOSIS — E876 Hypokalemia: Secondary | ICD-10-CM | POA: Diagnosis present

## 2024-10-08 LAB — COMPREHENSIVE METABOLIC PANEL WITH GFR
ALT: 25 U/L (ref 0–44)
AST: 23 U/L (ref 15–41)
Albumin: 4.5 g/dL (ref 3.5–5.0)
Alkaline Phosphatase: 48 U/L (ref 38–126)
Anion gap: 16 — ABNORMAL HIGH (ref 5–15)
BUN: 27 mg/dL — ABNORMAL HIGH (ref 6–20)
CO2: 25 mmol/L (ref 22–32)
Calcium: 10 mg/dL (ref 8.9–10.3)
Chloride: 96 mmol/L — ABNORMAL LOW (ref 98–111)
Creatinine, Ser: 0.9 mg/dL (ref 0.61–1.24)
GFR, Estimated: >60 mL/min (ref >60)
Glucose, Bld: 113 mg/dL — ABNORMAL HIGH (ref 70–99)
Potassium: 3.2 mmol/L — ABNORMAL LOW (ref 3.5–5.1)
Sodium: 136 mmol/L (ref 135–145)
Total Bilirubin: 0.8 mg/dL (ref 0.0–1.2)
Total Protein: 7.5 g/dL (ref 6.5–8.1)

## 2024-10-08 LAB — CBC WITH DIFFERENTIAL/PLATELET
Abs Immature Granulocytes: 0.02 K/uL (ref 0.00–0.07)
Basophils Absolute: 0 K/uL (ref 0.0–0.1)
Basophils Relative: 1 %
Eosinophils Absolute: 0.1 K/uL (ref 0.0–0.5)
Eosinophils Relative: 1 %
HCT: 58 % — ABNORMAL HIGH (ref 39.0–52.0)
Hemoglobin: 19 g/dL — ABNORMAL HIGH (ref 13.0–17.0)
Immature Granulocytes: 0 %
Lymphocytes Relative: 17 %
Lymphs Abs: 1.3 K/uL (ref 0.7–4.0)
MCH: 27.9 pg (ref 26.0–34.0)
MCHC: 32.8 g/dL (ref 30.0–36.0)
MCV: 85.2 fL (ref 80.0–100.0)
Monocytes Absolute: 0.8 K/uL (ref 0.1–1.0)
Monocytes Relative: 10 %
Neutro Abs: 5.6 K/uL (ref 1.7–7.7)
Neutrophils Relative %: 71 %
Platelets: 217 K/uL (ref 150–400)
RBC: 6.81 MIL/uL — ABNORMAL HIGH (ref 4.22–5.81)
RDW: 12.2 % (ref 11.5–15.5)
WBC: 7.8 K/uL (ref 4.0–10.5)
nRBC: 0 % (ref 0.0–0.2)

## 2024-10-08 LAB — I-STAT CG4 LACTIC ACID, ED: Lactic Acid, Venous: 2.3 mmol/L (ref 0.5–1.9)

## 2024-10-08 LAB — MAGNESIUM: Magnesium: 2.6 mg/dL — ABNORMAL HIGH (ref 1.7–2.4)

## 2024-10-08 LAB — PHOSPHORUS: Phosphorus: 2.8 mg/dL (ref 2.5–4.6)

## 2024-10-08 LAB — LIPASE, BLOOD: Lipase: 46 U/L (ref 11–51)

## 2024-10-08 MED ORDER — LACTATED RINGERS IV BOLUS
1000.0000 mL | Freq: Once | INTRAVENOUS | Status: AC
  Administered 2024-10-08: 1000 mL via INTRAVENOUS

## 2024-10-08 MED ORDER — MORPHINE SULFATE (PF) 4 MG/ML IV SOLN
4.0000 mg | Freq: Once | INTRAVENOUS | Status: AC
  Administered 2024-10-09: 4 mg via INTRAVENOUS
  Filled 2024-10-08: qty 1

## 2024-10-08 MED ORDER — IOHEXOL 350 MG/ML SOLN
75.0000 mL | Freq: Once | INTRAVENOUS | Status: AC | PRN
  Administered 2024-10-08: 75 mL via INTRAVENOUS

## 2024-10-08 NOTE — ED Triage Notes (Signed)
 Pt bib EMS from home. Pt has mouth wired shut from previous incident. Family called due to pt refusing to eat and drink as much as family would like. Saying pt is drinking some small sips of ensure but not enough. Per EMS family also stated he is not getting up and walking around he just lays on the couch.  Pt states he is dizzy. Pt also confused on date but able to answer other questions appropriately.   140/100 100 17 CBG 106 98

## 2024-10-08 NOTE — ED Provider Notes (Signed)
 Care assumed from Dr. Armenta.  Patient here with abnormal extraocular movements and dizziness.  CTA is negative for posterior circulation problem or aneurysm.  Plan to transfer to [facility] for MRI and neurology evaluation. Dr. Cottie has accepted patient.    Carita Senior, MD 10/09/24 [phone]

## 2024-10-08 NOTE — ED Notes (Signed)
 Pt said he is unable to urinate for UA without receiving fluids. Says he is dehydrated

## 2024-10-08 NOTE — ED Provider Notes (Signed)
 [place] EMERGENCY DEPARTMENT AT [facility] Provider Note   CSN: [id number] Arrival date & time: 10/08/24  1805     Patient presents with: Dizziness   [name] is a 38 y.o. male.   HPI Patient had a prior open jaw fracture that was treated with ORIF by Dr. Roark [phone].  Arch bars and wires were placed.  These were supposed to been removed a couple days ago but the patient has been having symptoms of poor oral intake, vomiting, general weakness and confusion.  He did not make his appointment 2 days ago.  Patient's mother reports that they called EMS 3 days ago because the patient was not eating and drinking and seemed weak.  At that time he did not wish to be transported to the emergency department.  They checked CBG and blood pressures which were okay at the time.  However, patient has been staying with the sister and she has reported that he has been extremely weak and not getting up out of bed.  A couple days ago they noticed he was having strange eye motions.  Patient's eyes have been moving and various directions and the patient reports that he feels dizzy and has double vision and nausea.  He has not had a fever since his surgery.  He denies focus of pain.    Prior to Admission medications   Medication Sig Start Date End Date Taking? Authorizing Provider  cephALEXin  (KEFLEX ) 250 MG capsule Take by mouth 3 (three) times daily.    [provider]  chlorhexidine  (PERIDEX ) 0.12 % solution Use as directed 15 mLs in the mouth or throat 2 (two) times daily for 16 days. 09/29/24 10/15/24  Hedges, Reyes, PA-C  HYDROcodone -acetaminophen  (NORCO) 7.5-325 MG tablet Take 1 tablet by mouth every 6 (six) hours as needed for moderate pain (pain score 4-6).    [provider]  ondansetron  (ZOFRAN -ODT) 4 MG disintegrating tablet Take 1 tablet (4 mg total) by mouth every 8 (eight) hours as needed for nausea or vomiting. 09/24/24   Cottie Donnice PARAS, MD     Allergies: Strawberry extract    Review of Systems  Updated Vital Signs BP (!) 147/102   Pulse 74   Temp 97.8 F (36.6 C) (Oral)   Resp 17   Ht 6' (1.829 m)   Wt 79.4 kg   SpO2 100%   BMI 23.73 kg/m   Physical Exam Constitutional:      Comments: Patient is awake.  He is alert but is having unusual eye movements and looking in different directions.  No respiratory distress.  HENT:     Head:     Comments: Patient does not have any facial swelling or appearance of jaw at this deformity or erythema.    Mouth/Throat:     Comments: Patient has a difficult time allowing me to move his soft tissues to examine his mouth.  He has a fixation bar on the lower ridge beneath the incisors.  And on the upper ridge over the incisors as well.  There has been extremely poor oral hygiene.  There is significant amounts of plaque and debris around the teeth and apparatuses.  Breath is very malodorous.  Patient can open his jaw slightly posterior airway is widely patent.  No difficulty with secretions.  Voice is clear. Eyes:     Comments: Patient does not have any periorbital swelling or erythema.  The conjunctive are normal.  Patient is moving his eyes in multiple different  directions.  There is predominantly crossing of the eyes and then sometimes gazing off in another direction.  He is not keeping his eyes in 1 particularly conjugate position.  If I am looking at him he might gaze away but then if he looks towards me he will have crossed eyes.  If patient looks upward there may be slight disconjugate and then eyes drift downward.  The pupils are symmetric about 3 mm and responsive.  The cornea are clear.  Neck:     Comments: Neck is supple. Cardiovascular:     Rate and Rhythm: Normal rate and regular rhythm.  Pulmonary:     Effort: Pulmonary effort is normal.     Breath sounds: Normal breath sounds.  Abdominal:     General: There is no distension.     Palpations: Abdomen is soft.      Tenderness: There is no guarding.  Musculoskeletal:        General: Normal range of motion.     Cervical back: Neck supple.     Comments: Patient does not have any extremity deformities no peripheral edema.  Skin:    General: Skin is warm and dry.  Neurological:     Comments: Patient seems situationally oriented.  He will at times answer questions and talk to his mother and he does not seem confused.  Patient can follow commands for grip strength.  When asked to follow my finger for extraocular motions, patient's eyes will go both centrally towards the medial gaze and if he looks latrally, one eye might cross more and the other 1 look laterally.  He may temporarily follow my finger and then have an abrupt directional change of gaze.  This really is not following any specific pattern.    No facial swelling.  No scleral injection. (all labs ordered are listed, but only abnormal results are displayed) Labs Reviewed  COMPREHENSIVE METABOLIC PANEL WITH GFR - Abnormal; Notable for the following components:      Result Value   Potassium 3.2 (*)    Chloride 96 (*)    Glucose, Bld 113 (*)    BUN 27 (*)    Anion gap 16 (*)    All other components within normal limits  CBC WITH DIFFERENTIAL/PLATELET - Abnormal; Notable for the following components:   RBC 6.81 (*)    Hemoglobin 19.0 (*)    HCT 58.0 (*)    All other components within normal limits  MAGNESIUM - Abnormal; Notable for the following components:   Magnesium 2.6 (*)    All other components within normal limits  I-STAT CG4 LACTIC ACID, ED - Abnormal; Notable for the following components:   Lactic Acid, Venous 2.3 (*)    All other components within normal limits  CULTURE, BLOOD (ROUTINE X 2)  CULTURE, BLOOD (ROUTINE X 2)  LIPASE, BLOOD  PHOSPHORUS  URINALYSIS, ROUTINE W REFLEX MICROSCOPIC  I-STAT CG4 LACTIC ACID, ED    EKG: EKG Interpretation Date/Time:  Saturday October 08 2024 19:34:51 EDT Ventricular Rate:  74 PR  Interval:  143 QRS Duration:  89 QT Interval:  396 QTC Calculation: 440 R Axis:   79  Text Interpretation: Sinus rhythm Biatrial enlargement Left ventricular hypertrophy ST elev, probable normal early repol pattern QRS morphology unchanged from previous. compared to previous tracing, diffuse t wave flattening. no acute ischemic appearance Confirmed by Armenta Canning [phone]) on 10/08/2024 9:29:00 PM  Radiology: CT Maxillofacial WO CM Result Date: 10/08/2024 EXAM: CT OF THE FACE WITHOUT CONTRAST 10/08/2024  09:03:46 PM TECHNIQUE: CT of the face was performed without the administration of intravenous contrast. Multiplanar reformatted images are provided for review. Automated exposure control, iterative reconstruction, and/or weight based adjustment of the mA/kV was utilized to reduce the radiation dose to as low as reasonably achievable. 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 Pt has mouth wired shut from previous incident. Family called due to pt refusing to eat and drink as much as family would like. Saying pt is drinking some small sips of ensure but not enough. Per EMS family also stated he is not ; getting up and walking around he just lays on the couch. ; Pt states he is dizzy. Pt also confused on date but able to answer other questions appropriately. FINDINGS: FACIAL BONES: Re-demonstration of a subacute displaced right mandibular fracture at the junction of the condyle and ramus - minimal periosteal reaction identified. Plate and screw fixation of a subacute displaced and comminuted midline and slightly left of midline mandibular body fracture. Plate and screw fixation of the maxilla and mandible, likely temporary. No acute fracture. Multiple periapical lucencies along the teeth. Caries are noted. No suspicious bone lesion. ORBITS:  Globes are intact. No acute traumatic injury. No inflammatory change. SINUSES AND MASTOIDS: No acute abnormality. SOFT TISSUES: No acute abnormality. IMPRESSION: 1. No acute facial fracture. 2. Subacute displaced right mandibular fracture at the junction of the condyle and ramus - minimal periosteal reaction. Plate and screw fixation of a subacute displaced and comminuted left mandibular body fracture. Plate and screw fixation of the maxilla and mandible, likely temporary. Electronically signed by: Morgane Naveau MD 10/08/2024 09:22 PM EDT RP Workstation: [id number]   CT Head Wo Contrast Result Date: 10/08/2024 EXAM: CT HEAD WITHOUT CONTRAST 10/08/2024 09:03:46 PM TECHNIQUE: CT of the head was performed without the administration of intravenous contrast. Automated exposure control, iterative reconstruction, and/or weight based adjustment of the mA/kV was utilized to reduce the radiation dose to as low as reasonably achievable. COMPARISON: CT head 08/23/2024. CLINICAL HISTORY: Neuro deficit, acute, stroke suspected. 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 Patient brought by EMS from home. Patient has mouth wired shut from previous incident. Family called due to patient refusing to eat and drink as much as family would like. Patient is drinking some small sips of Ensure but not enough. Per EMS, family also stated he is not getting up and walking around; he just lays on the couch. Patient states he is dizzy. Patient also confused on date but able to answer other questions appropriately. FINDINGS: BRAIN AND VENTRICLES: No acute hemorrhage. No evidence of acute infarct. No hydrocephalus. No extra-axial collection. No mass effect or midline shift. ORBITS: No acute abnormality. SINUSES: Polypoid mucosal thickening of the right maxillary sinus. SOFT TISSUES AND SKULL: No acute soft tissue abnormality. No skull fracture. IMPRESSION: 1. No acute intracranial abnormality. Electronically signed by: Morgane Naveau MD  10/08/2024 09:15 PM EDT RP Workstation: [id number]     Procedures   Medications Ordered in the ED  iohexol (OMNIPAQUE) 350 MG/ML injection 75 mL (has no administration in time range)  lactated ringers  bolus 1,000 mL (1,000 mLs Intravenous New Bag/Given 10/08/24 1948)  lactated ringers  bolus 1,000 mL (1,000 mLs Intravenous New Bag/Given 10/08/24 2039)  Medical Decision Making Amount and/or Complexity of Data Reviewed Labs: ordered. Radiology: ordered.  Risk Prescription drug management. Decision regarding hospitalization.  Patient has had jaw fixation by Dr. Roark as outlined.  He has had apparently a lot of difficulty with oral intake and vomiting.  Reportedly patient's been spending a lot of time in bed.  He is however alert and nontoxic.  He is not hypotensive or tachycardic.  Afebrile.  A defining features presentation is very atypical ocular movements.  Movements are not coinciding with 1 particular deficit.  The eyes themselves appear normal.  Will obtain lab work including metabolic panels electrolytes and blood counts.  Will proceed with CT head imaging.  Consult: Reviewed with Dr. Deedra.  Recommends CT angio to rule out posterior circulation dissection.  TBD with radiology if patient's internal fixator is stable for MRI.  Consult: Reviewed with neuroradiology Dr. .  All hardware used in fixations is stable for MRI.  It will be appropriate to get MRI brain.  CT orbits were reviewed and likely will have scattered but no orbit contents abnormalities present on CT.  CT angiogram is pending at this time.  Patient remained stable.  Vital signs are stable.  He is not vomiting.  He is getting IV fluids.  He has been drinking oral fluids 2.  He is nontoxic I do not see an indication at this time for antibiotics.  He has had very poor oral hygiene in the setting of his fracture stabilizations.  However, there is no facial swelling or posterior airway  swelling.  Currently is unclear what the etiology could be for patient's very atypical ocular motions.  Will proceed with MRI conversation with Dr. Deedra and radiology.  Dr. Cottie accepts for Ed to ED for MRI.  Once diagnostic evaluation completed, additional evaluation or consultation will be based on patient's ongoing symptoms.  If patient persists with significant ocular motion abnormalities and visual disturbance will likely need consultation with neurology and possibly ophthalmology.     Final diagnoses:  INO (internuclear ophthalmoplegia), bilateral  Nausea and vomiting, unspecified vomiting type  Ataxia    ED Discharge Orders     None          Armenta Canning, MD 10/08/24 2316    Armenta Canning, MD 10/09/24 2002
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
## 2024-10-09 DIAGNOSIS — H5123 Internuclear ophthalmoplegia, bilateral: Secondary | ICD-10-CM | POA: Diagnosis present

## 2024-10-09 DIAGNOSIS — R112 Nausea with vomiting, unspecified: Secondary | ICD-10-CM | POA: Diagnosis present

## 2024-10-09 DIAGNOSIS — G379 Demyelinating disease of central nervous system, unspecified: Secondary | ICD-10-CM

## 2024-10-09 DIAGNOSIS — F1210 Cannabis abuse, uncomplicated: Secondary | ICD-10-CM

## 2024-10-09 DIAGNOSIS — R270 Ataxia, unspecified: Secondary | ICD-10-CM | POA: Diagnosis present

## 2024-10-09 DIAGNOSIS — F1010 Alcohol abuse, uncomplicated: Secondary | ICD-10-CM

## 2024-10-09 DIAGNOSIS — I639 Cerebral infarction, unspecified: Secondary | ICD-10-CM

## 2024-10-09 DIAGNOSIS — R531 Weakness: Secondary | ICD-10-CM | POA: Diagnosis not present

## 2024-10-09 DIAGNOSIS — E512 Wernicke's encephalopathy: Principal | ICD-10-CM

## 2024-10-09 DIAGNOSIS — I10 Essential (primary) hypertension: Secondary | ICD-10-CM | POA: Diagnosis not present

## 2024-10-09 DIAGNOSIS — R0902 Hypoxemia: Secondary | ICD-10-CM | POA: Diagnosis not present

## 2024-10-09 DIAGNOSIS — R630 Anorexia: Secondary | ICD-10-CM

## 2024-10-09 DIAGNOSIS — R42 Dizziness and giddiness: Secondary | ICD-10-CM | POA: Diagnosis not present

## 2024-10-09 DIAGNOSIS — Z743 Need for continuous supervision: Secondary | ICD-10-CM | POA: Diagnosis not present

## 2024-10-09 LAB — URINALYSIS, ROUTINE W REFLEX MICROSCOPIC
Bacteria, UA: NONE SEEN
Bilirubin Urine: NEGATIVE
Glucose, UA: NEGATIVE mg/dL
Hgb urine dipstick: NEGATIVE
Ketones, ur: 20 mg/dL — AB
Leukocytes,Ua: NEGATIVE
Nitrite: NEGATIVE
Protein, ur: 30 mg/dL — AB
Specific Gravity, Urine: >1.046 — ABNORMAL HIGH (ref 1.005–1.030)
pH: 5 (ref 5.0–8.0)

## 2024-10-09 LAB — ETHANOL: Alcohol, Ethyl (B): <15 mg/dL (ref <15)

## 2024-10-09 LAB — I-STAT CG4 LACTIC ACID, ED: Lactic Acid, Venous: 1.9 mmol/L (ref 0.5–1.9)

## 2024-10-09 LAB — RAPID URINE DRUG SCREEN, HOSP PERFORMED
Amphetamines: NOT DETECTED
Barbiturates: NOT DETECTED
Benzodiazepines: NOT DETECTED
Cocaine: NOT DETECTED
Opiates: POSITIVE — AB
Tetrahydrocannabinol: POSITIVE — AB

## 2024-10-09 LAB — VITAMIN B12: Vitamin B-12: 1187 pg/mL — ABNORMAL HIGH (ref 180–914)

## 2024-10-09 LAB — SALICYLATE LEVEL: Salicylate Lvl: <7 mg/dL — ABNORMAL LOW (ref 7.0–30.0)

## 2024-10-09 LAB — ACETAMINOPHEN LEVEL: Acetaminophen (Tylenol), Serum: <10 ug/mL — ABNORMAL LOW (ref 10–30)

## 2024-10-09 MED ORDER — THIAMINE HCL 100 MG/ML IJ SOLN
250.0000 mg | Freq: Every day | INTRAVENOUS | Status: DC
  Administered 2024-10-11 – 2024-10-12 (×2): 250 mg via INTRAVENOUS
  Filled 2024-10-09 (×2): qty 2.5

## 2024-10-09 MED ORDER — THIAMINE MONONITRATE 100 MG PO TABS: 100.0000 mg | ORAL_TABLET | Freq: Every day | ORAL | Status: DC

## 2024-10-09 MED ORDER — LORAZEPAM 1 MG PO TABS: 1.0000 mg | ORAL_TABLET | ORAL | Status: AC | PRN

## 2024-10-09 MED ORDER — THIAMINE HCL 100 MG/ML IJ SOLN: 100.0000 mg | Freq: Every day | INTRAMUSCULAR | Status: DC

## 2024-10-09 MED ORDER — ADULT MULTIVITAMIN W/MINERALS CH
1.0000 | ORAL_TABLET | Freq: Every day | ORAL | Status: DC
  Administered 2024-10-09 – 2024-10-12 (×4): 1 via ORAL
  Filled 2024-10-09 (×4): qty 1

## 2024-10-09 MED ORDER — ONDANSETRON HCL 4 MG/2ML IJ SOLN: 4.0000 mg | Freq: Four times a day (QID) | INTRAMUSCULAR | Status: DC | PRN

## 2024-10-09 MED ORDER — LORAZEPAM 2 MG/ML IJ SOLN: 1.0000 mg | INTRAMUSCULAR | Status: AC | PRN

## 2024-10-09 MED ORDER — SODIUM CHLORIDE 0.9 % IV SOLN
500.0000 mg | Freq: Three times a day (TID) | INTRAVENOUS | Status: AC
  Administered 2024-10-09 – 2024-10-10 (×6): 500 mg via INTRAVENOUS
  Filled 2024-10-09 (×3): qty 5
  Filled 2024-10-09 (×2): qty 500
  Filled 2024-10-09: qty 5

## 2024-10-09 MED ORDER — PNEUMOCOCCAL 20-VAL CONJ VACC 0.5 ML IM SUSY
0.5000 mL | PREFILLED_SYRINGE | INTRAMUSCULAR | Status: AC
  Administered 2024-10-10: 0.5 mL via INTRAMUSCULAR
  Filled 2024-10-09: qty 0.5

## 2024-10-09 MED ORDER — DEXTROSE 5 % IV SOLN: Freq: Once | INTRAVENOUS | Status: AC

## 2024-10-09 MED ORDER — THIAMINE HCL 100 MG/ML IJ SOLN
100.0000 mg | Freq: Every day | INTRAMUSCULAR | Status: DC
Start: 2024-10-09 — End: 2024-10-09

## 2024-10-09 MED ORDER — THIAMINE HCL 100 MG/ML IJ SOLN: 500.0000 mg | Freq: Once | INTRAVENOUS | Status: DC

## 2024-10-09 MED ORDER — THIAMINE HCL 100 MG/ML IJ SOLN: 100.0000 mg | Freq: Once | INTRAMUSCULAR | Status: DC

## 2024-10-09 MED ORDER — INFLUENZA VIRUS VACC SPLIT PF (FLUZONE) 0.5 ML IM SUSY
0.5000 mL | PREFILLED_SYRINGE | INTRAMUSCULAR | Status: AC
  Administered 2024-10-10: 0.5 mL via INTRAMUSCULAR
  Filled 2024-10-09: qty 0.5

## 2024-10-09 MED ORDER — SODIUM CHLORIDE 0.9 % IV SOLN: INTRAVENOUS | Status: AC

## 2024-10-09 MED ORDER — FOLIC ACID 1 MG PO TABS
1.0000 mg | ORAL_TABLET | Freq: Every day | ORAL | Status: DC
  Administered 2024-10-09 – 2024-10-12 (×4): 1 mg via ORAL
  Filled 2024-10-09 (×4): qty 1
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
## 2024-10-09 NOTE — ED Notes (Signed)
 CCMD called for cardiac monitoring.

## 2024-10-09 NOTE — ED Notes (Signed)
 [facility] called for transport.

## 2024-10-10 ENCOUNTER — Inpatient Hospital Stay (HOSPITAL_COMMUNITY): Payer: Self-pay

## 2024-10-10 DIAGNOSIS — Z87891 Personal history of nicotine dependence: Secondary | ICD-10-CM | POA: Diagnosis not present

## 2024-10-10 DIAGNOSIS — Z23 Encounter for immunization: Secondary | ICD-10-CM | POA: Diagnosis not present

## 2024-10-10 DIAGNOSIS — E860 Dehydration: Secondary | ICD-10-CM | POA: Diagnosis present

## 2024-10-10 DIAGNOSIS — E512 Wernicke's encephalopathy: Secondary | ICD-10-CM | POA: Diagnosis present

## 2024-10-10 DIAGNOSIS — E876 Hypokalemia: Secondary | ICD-10-CM | POA: Diagnosis present

## 2024-10-10 DIAGNOSIS — R270 Ataxia, unspecified: Secondary | ICD-10-CM | POA: Diagnosis present

## 2024-10-10 DIAGNOSIS — J45909 Unspecified asthma, uncomplicated: Secondary | ICD-10-CM | POA: Diagnosis present

## 2024-10-10 DIAGNOSIS — H532 Diplopia: Secondary | ICD-10-CM | POA: Diagnosis present

## 2024-10-10 DIAGNOSIS — R112 Nausea with vomiting, unspecified: Secondary | ICD-10-CM | POA: Diagnosis not present

## 2024-10-10 DIAGNOSIS — S02609D Fracture of mandible, unspecified, subsequent encounter for fracture with routine healing: Secondary | ICD-10-CM | POA: Diagnosis not present

## 2024-10-10 DIAGNOSIS — I639 Cerebral infarction, unspecified: Secondary | ICD-10-CM | POA: Diagnosis not present

## 2024-10-10 DIAGNOSIS — F1010 Alcohol abuse, uncomplicated: Secondary | ICD-10-CM | POA: Diagnosis present

## 2024-10-10 DIAGNOSIS — H5123 Internuclear ophthalmoplegia, bilateral: Secondary | ICD-10-CM | POA: Diagnosis present

## 2024-10-10 DIAGNOSIS — E639 Nutritional deficiency, unspecified: Secondary | ICD-10-CM | POA: Diagnosis present

## 2024-10-10 LAB — BASIC METABOLIC PANEL WITH GFR
Anion gap: 10 (ref 5–15)
BUN: 11 mg/dL (ref 6–20)
CO2: 28 mmol/L (ref 22–32)
Calcium: 9.3 mg/dL (ref 8.9–10.3)
Chloride: 99 mmol/L (ref 98–111)
Creatinine, Ser: 1.03 mg/dL (ref 0.61–1.24)
GFR, Estimated: >60 mL/min (ref >60)
Glucose, Bld: 92 mg/dL (ref 70–99)
Potassium: 3.4 mmol/L — ABNORMAL LOW (ref 3.5–5.1)
Sodium: 137 mmol/L (ref 135–145)

## 2024-10-10 LAB — HIV ANTIBODY (ROUTINE TESTING W REFLEX): HIV Screen 4th Generation wRfx: NONREACTIVE

## 2024-10-10 MED ORDER — POTASSIUM CHLORIDE CRYS ER 20 MEQ PO TBCR
40.0000 meq | EXTENDED_RELEASE_TABLET | Freq: Once | ORAL | Status: AC
  Administered 2024-10-10: 40 meq via ORAL
  Filled 2024-10-10: qty 2

## 2024-10-10 MED ORDER — GADOBUTROL 1 MMOL/ML IV SOLN
8.0000 mL | Freq: Once | INTRAVENOUS | Status: AC | PRN
  Administered 2024-10-10: 8 mL via INTRAVENOUS

## 2024-10-10 MED ORDER — KETOROLAC TROMETHAMINE 15 MG/ML IJ SOLN
15.0000 mg | INTRAMUSCULAR | Status: AC
  Administered 2024-10-10: 15 mg via INTRAVENOUS
  Filled 2024-10-10: qty 1
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
## 2024-10-10 NOTE — Progress Notes (Signed)
 Patient verbalized some dizziness while ambulating to the restroom.  I assisted patient to the restroom and back to his bed.

## 2024-10-10 NOTE — Progress Notes (Signed)
 Inpatient Rehab Admissions Coordinator:   Per therapy recommendations, patient was screened for CIR candidacy by Megan Salon, MS, CCC-SLP.  At this time, Pt. is not at a level to tolerate the intensity of CIR. However,  Pt. may have potential to progress to becoming a potential CIR candidate, so CIR admissions team will follow and monitor for progress and participation with therapies and place consult order if Pt. appears to be an appropriate candidate. Please contact me with any questions.   Megan Salon, MS, CCC-SLP Rehab Admissions Coordinator  [phone] (celll) [phone] (office)

## 2024-10-10 NOTE — Progress Notes (Signed)
 At time of admission patient refused SCD's and requested to remain in his clothes instead of hospital gown.
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
## 2024-10-10 NOTE — Progress Notes (Signed)
 Patient is awake but resting comfortably.  Denies any visual issues, nausea or vomiting at this time.

## 2024-10-10 NOTE — Plan of Care (Signed)
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
## 2024-10-11 NOTE — Plan of Care (Signed)
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
## 2024-10-12 ENCOUNTER — Ambulatory Visit: Payer: Self-pay

## 2024-10-12 ENCOUNTER — Other Ambulatory Visit (HOSPITAL_COMMUNITY): Payer: Self-pay

## 2024-10-12 DIAGNOSIS — E512 Wernicke's encephalopathy: Secondary | ICD-10-CM

## 2024-10-12 LAB — BASIC METABOLIC PANEL WITH GFR
Anion gap: 10 (ref 5–15)
BUN: 5 mg/dL — ABNORMAL LOW (ref 6–20)
CO2: 25 mmol/L (ref 22–32)
Calcium: 8.9 mg/dL (ref 8.9–10.3)
Chloride: 106 mmol/L (ref 98–111)
Creatinine, Ser: 0.95 mg/dL (ref 0.61–1.24)
GFR, Estimated: >60 mL/min (ref >60)
Glucose, Bld: 96 mg/dL (ref 70–99)
Potassium: 3 mmol/L — ABNORMAL LOW (ref 3.5–5.1)
Sodium: 141 mmol/L (ref 135–145)

## 2024-10-12 LAB — CBC WITH DIFFERENTIAL/PLATELET
Abs Immature Granulocytes: 0.02 K/uL (ref 0.00–0.07)
Basophils Absolute: 0 K/uL (ref 0.0–0.1)
Basophils Relative: 1 %
Eosinophils Absolute: 0.2 K/uL (ref 0.0–0.5)
Eosinophils Relative: 2 %
HCT: 44.9 % (ref 39.0–52.0)
Hemoglobin: 15 g/dL (ref 13.0–17.0)
Immature Granulocytes: 0 %
Lymphocytes Relative: 22 %
Lymphs Abs: 1.4 K/uL (ref 0.7–4.0)
MCH: 28.8 pg (ref 26.0–34.0)
MCHC: 33.4 g/dL (ref 30.0–36.0)
MCV: 86.3 fL (ref 80.0–100.0)
Monocytes Absolute: 0.6 K/uL (ref 0.1–1.0)
Monocytes Relative: 9 %
Neutro Abs: 4.2 K/uL (ref 1.7–7.7)
Neutrophils Relative %: 66 %
Platelets: 156 K/uL (ref 150–400)
RBC: 5.2 MIL/uL (ref 4.22–5.81)
RDW: 12.3 % (ref 11.5–15.5)
WBC: 6.5 K/uL (ref 4.0–10.5)
nRBC: 0 % (ref 0.0–0.2)

## 2024-10-12 LAB — METHYLMALONIC ACID, SERUM: Methylmalonic Acid, Quantitative: 114 nmol/L (ref 0–378)

## 2024-10-12 LAB — VITAMIN B1: Vitamin B1 (Thiamine): 81.8 nmol/L (ref 66.5–200.0)

## 2024-10-12 MED ORDER — THIAMINE HCL 100 MG PO TABS
100.0000 mg | ORAL_TABLET | Freq: Every day | ORAL | 0 refills | Status: DC
  Filled 2024-10-12: qty 90, 90d supply, fill #0

## 2024-10-12 MED ORDER — POTASSIUM CHLORIDE 20 MEQ PO PACK
40.0000 meq | PACK | Freq: Once | ORAL | Status: AC
  Administered 2024-10-12: 40 meq via ORAL
  Filled 2024-10-12: qty 2

## 2024-10-12 MED ORDER — ADULT MULTIVITAMIN W/MINERALS CH
1.0000 | ORAL_TABLET | Freq: Every day | ORAL | 0 refills | Status: AC
  Filled 2024-10-12: qty 90, 90d supply, fill #0

## 2024-10-12 MED ORDER — FOLIC ACID 1 MG PO TABS
1.0000 mg | ORAL_TABLET | Freq: Every day | ORAL | 0 refills | Status: AC
  Filled 2024-10-12: qty 90, 90d supply, fill #0

## 2024-10-12 NOTE — Telephone Encounter (Signed)
 Caller disconnected prior to triage. This RN attempted to call back, left VM for [facility] the hospital case manager for the pt with call back number of [facility] as that is where she is attempting to establish care for pt.   Copied from CRM [id number]. Topic: Clinical - Red Word Triage >> Oct 12, 2024 12:02 PM Fonda T wrote: [facility] that prompted transfer to Nurse Triage: Received call from [facility] case manager, calling to schedule an urgent appointment for patient, currently in hospital with encephalopathy.  Requesting an appointment for patient as soon as possible, due to cognitive issues as well.
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
## 2024-10-12 NOTE — Plan of Care (Signed)

## 2024-10-12 NOTE — Progress Notes (Addendum)
 Transition of Care [facility]) - Inpatient Brief Assessment   Patient Details  Name: [name] MRN: [id number] Date of Birth: [birth year]  Transition of Care [facility]) CM/SW Contact:    Rosaline JONELLE Joe, RN Phone Number: 10/12/2024, 12:01 PM   Clinical Narrative: Patient admitted to the hospital with intractable N/V.  Patient states that he was consuming ETOH and THC prior to admission.  The patient states that he normally lives alone but has been staying with his sister/mother prior to admission and plans to return with family care.  The patient was offered substance abuse resources and patient was agreeable - resources included in the AVS.  Patient has no PCP and patient will be placed on [facility] for [facility] follow up.  The patient states that his family provides transportation for appointments.  Referral placed for Outpatient therapy - co-signed by MD.  Patient to be seen by SLP today and will likely be discharged later today to home.  Patient states that his friend/family will provide transportation.  MD requested to send discharge meds through [facility] pharmacy.  Patient was placed on the waiting list for hospital follow up with [facility].  The office will call the patient to schedule a hospital follow up when triage RN is available to assist.  Email sent to [employer] to assist the patient with Medicaid application.  10/12/24 1433 - Patient is being discharged home to the patient's sister's home.  I called the [facility] back but was unable to reach the triage RN.  I asked that the office please call the patient back to schedule a hospital follow up.  [employer] requested to deliver a charity Cane to the patient's hospital room prior to discharge.   Transition of Care Asessment: Insurance and Status: (P) Selfpay Patient has primary care physician: (P) No (PCP to be set up by CM) Home environment has been reviewed: (P) from home  with sister per patient Prior level of function:: (P) self Prior/Current Home Services: (P) No current home services Social Drivers of Health Review: (P) SDOH reviewed needs interventions Readmission risk has been reviewed: (P) Yes Transition of care needs: (P) transition of care needs identified, TOC will continue to follow

## 2024-10-12 NOTE — Telephone Encounter (Signed)
 This RN made 3rd attempt to reach Rosaline, Nursing Case Manager with [facility]. Left voicemail with [facility] Patient [facility] number as per previous documentation, that's where they are trying to set up a patient appointment.

## 2024-10-13 ENCOUNTER — Telehealth: Payer: Self-pay | Admitting: General Practice

## 2024-10-13 LAB — CULTURE, BLOOD (ROUTINE X 2): Culture: NO GROWTH

## 2024-10-13 NOTE — Telephone Encounter (Signed)
 Copied from CRM #[id number]. Topic: Appointments - Appointment Info/Confirmation >> Oct 12, 2024  2:33 PM Montie POUR wrote: Patient/patient representative is calling for information regarding an appointment.  Please call Rosaline at [facility] to set up a new patient hospital follow up. Ahmad will discharge today. Hospital notes are in chart. Rosaline would like and appointment as soon as possible. Her number is [phone]. Thanks

## 2024-10-14 LAB — CULTURE, BLOOD (ROUTINE X 2)
Culture: NO GROWTH
Special Requests: ADEQUATE

## 2024-10-17 ENCOUNTER — Telehealth (INDEPENDENT_AMBULATORY_CARE_PROVIDER_SITE_OTHER): Payer: Self-pay | Admitting: Physician Assistant

## 2024-10-17 NOTE — Telephone Encounter (Signed)
 I returned the patient's mother's voicemail and let her know we can get him scheduled for the follow-up appointment with Chyrl and to please call back.

## 2024-10-18 ENCOUNTER — Encounter (INDEPENDENT_AMBULATORY_CARE_PROVIDER_SITE_OTHER): Payer: Self-pay | Admitting: Physician Assistant

## 2024-10-18 ENCOUNTER — Ambulatory Visit (INDEPENDENT_AMBULATORY_CARE_PROVIDER_SITE_OTHER): Payer: Self-pay | Admitting: Physician Assistant

## 2024-10-18 ENCOUNTER — Telehealth (INDEPENDENT_AMBULATORY_CARE_PROVIDER_SITE_OTHER): Payer: Self-pay | Admitting: Physician Assistant

## 2024-10-18 VITALS — BP 122/77 | HR 76 | Temp 98.4°F

## 2024-10-18 DIAGNOSIS — S0269XD Fracture of mandible of other specified site, subsequent encounter for fracture with routine healing: Secondary | ICD-10-CM

## 2024-10-18 MED ORDER — CHLORHEXIDINE GLUCONATE 0.12 % MT SOLN: 15.0000 mL | Freq: Two times a day (BID) | OROMUCOSAL | 0 refills | Status: AC

## 2024-10-18 NOTE — Telephone Encounter (Signed)
 Note not needed

## 2024-10-18 NOTE — Progress Notes (Signed)
 Dear Dr. Rexford ref. provider found, Here is my assessment for our mutual [name], [name]. Thank you for allowing me Jack opportunity to care for your [name]. Please do not hesitate to contact me should you have any other questions. Sincerely, Chyrl Cohen PA-C  Otolaryngology Clinic Note Referring provider: Dr. Rexford ref. provider found HPI:  [name] is a 38 y.o. male kindly referred by Dr. No ref. provider found   [name] [name] is a 38 year old gentleman seen in our office for follow-up evaluation status post open reduction internal fixation of mandibular fracture by Dr. Roark on 08/23/2024.  [name] was last seen in Jack office on 09/27/2024.  At that time arch bars were removed.  Since that time he was hospitalized for nausea and vomiting.  He denies any intraoral issues.  He denies any pain in Jack jaw, he has been eating normally without significant discomfort.  He denies any malocclusion.  No swelling.   Independent Review of Additional Tests or Records:     PMH/Meds/All/SocHx/FamHx/ROS:   Past Medical History:  Diagnosis Date   Asthma      Past Surgical History:  Procedure Laterality Date   ORIF MANDIBULAR FRACTURE N/A 08/23/2024   Procedure: OPEN REDUCTION INTERNAL FIXATION (ORIF) MANDIBULAR FRACTURE;  Surgeon: Roark Rush, MD;  Location: [facility] OR;  Service: ENT;  Laterality: N/A;    History reviewed. No pertinent family history.   Social Connections: Socially Isolated (08/10/2023)   Social Connection and Isolation Panel    Frequency of Communication with Friends and Family: More than three times a week    Frequency of Social Gatherings with Friends and Family: Once a week    Attends Religious Services: Never    Database Administrator or Organizations: No    Attends Banker Meetings: Never    Marital Status: Never married      Current Outpatient Medications:    folic acid (FOLVITE) 1 MG tablet, Take 1 tablet (1 mg total) by mouth  daily., Disp: 90 tablet, Rfl: 0   Multiple Vitamin (MULTIVITAMIN WITH MINERALS) TABS tablet, Take 1 tablet by mouth daily., Disp: 90 tablet, Rfl: 0   thiamine (VITAMIN B1) 100 MG tablet, Take 1 tablet (100 mg total) by mouth daily., Disp: 90 tablet, Rfl: 0   Physical Exam:   BP 122/77   Pulse 76   Temp 98.4 F (36.9 C)   SpO2 99%   Pertinent Findings  CN II-XII intact MMF brackets in place, screws are tight, wires tight, gingivolabial sulcus incision intact with no dehiscence, no swelling, no pain with palpation of Jack jaw No obviously palpable neck masses/lymphadenopathy/thyromegaly No respiratory distress or strido  Seprately Identifiable Procedures:  None  Impression & Plans:  [name] is a 38 y.o. male with Jack following   Mandibular fracture-  [name] is doing well today, no pain in Jack jaw, no swelling, no wounds within Jack oral cavity.  After discussion arch bars were removed, he tolerated this well.  I will prescribe chlor hexedine rinse for Jack next several days.  I like to see him in Jack office if he develops any new or worsening signs or symptoms.  [name] verbalized understanding and agreement to today's plan had no further questions or concerns.   - f/u PRN   Thank you for allowing me Jack opportunity to care for your [name]. Please do not hesitate to contact me should you have any other questions.  Sincerely, Chyrl Cohen PA-C [facility] Phone: [phone] Fax: [phone]  10/18/2024, 11:02 AM

## 2024-10-25 ENCOUNTER — Ambulatory Visit: Payer: Self-pay | Admitting: Physical Therapy

## 2024-10-25 NOTE — Therapy (Incomplete)
 OUTPATIENT PHYSICAL THERAPY NEURO EVALUATION   Patient Name: [name] MRN: [id number] DOB:[birth year], 38 y.o., male Today's Date: 10/25/2024   PCP: None REFERRING PROVIDER: Arlice Reichert, MD  END OF SESSION:   Past Medical History:  Diagnosis Date   Asthma    Past Surgical History:  Procedure Laterality Date   ORIF MANDIBULAR FRACTURE N/A 08/23/2024   Procedure: OPEN REDUCTION INTERNAL FIXATION (ORIF) MANDIBULAR FRACTURE;  Surgeon: Roark Rush, MD;  Location: [facility] OR;  Service: ENT;  Laterality: N/A;   Patient Active Problem List   Diagnosis Date Noted   Intractable nausea and vomiting 10/09/2024   Encounter for psychological evaluation 08/10/2023    ONSET DATE: 10/12/2024 (referral)   REFERRING DIAG: H51.23 (ICD-10-CM) - INO (internuclear ophthalmoplegia), bilateral R27.0 (ICD-10-CM) - Ataxia  THERAPY DIAG:  No diagnosis found.  Rationale for Evaluation and Treatment: Rehabilitation  SUBJECTIVE:                                                                                                                                                                                             SUBJECTIVE STATEMENT: *** Pt accompanied by: {accompnied:27141}  PERTINENT HISTORY: etoh abuse, recent mandibular fx from trauma w/ internal fixation  PAIN:  Are you having pain? {OPRCPAIN:27236}  PRECAUTIONS: {Therapy precautions:24002}  RED FLAGS: {PT Red Flags:29287}   WEIGHT BEARING RESTRICTIONS: {Yes ***/No:24003}  FALLS: Has patient fallen in last 6 months? {fallsyesno:27318}  LIVING ENVIRONMENT: Lives with: {[facility] lives with:25569::lives with their family} Lives in: {Lives in:25570} Stairs: {opstairs:27293} Has following equipment at home: {Assistive devices:23999}  PLOF: {PLOF:24004}  PATIENT GOALS: ***  OBJECTIVE:  Note: Objective measures were completed at Evaluation unless otherwise noted.  DIAGNOSTIC FINDINGS: MRI of brain from  10/10/24  IMPRESSION: 1. Bithalamic hyperintense T2-weighted signal consistent with Wernicke encephalopathy.  CT maxillofacial from 10/08/24 IMPRESSION: 1. No acute facial fracture. 2. Subacute displaced right mandibular fracture at the junction of the condyle and ramus - minimal periosteal reaction. Plate and screw fixation of a subacute displaced and comminuted left mandibular body fracture. Plate and screw fixation of the maxilla and mandible, likely temporary.  COGNITION: Overall cognitive status: {cognition:24006}   SENSATION: {sensation:27233}  COORDINATION: ***  EDEMA:  {edema:24020}  MUSCLE TONE: {LE tone:25568}  MUSCLE LENGTH: Hamstrings: Right *** deg; Left *** deg Debby test: Right *** deg; Left *** deg  DTRs:  {DTR SITE:24025}  POSTURE: {posture:25561}  LOWER EXTREMITY ROM:     {AROM/PROM:27142}  Right Eval Left Eval  Hip flexion    Hip extension    Hip abduction    Hip adduction    Hip internal rotation  Hip external rotation    Knee flexion    Knee extension    Ankle dorsiflexion    Ankle plantarflexion    Ankle inversion    Ankle eversion     (Blank rows = not tested)  LOWER EXTREMITY MMT:    MMT Right Eval Left Eval  Hip flexion    Hip extension    Hip abduction    Hip adduction    Hip internal rotation    Hip external rotation    Knee flexion    Knee extension    Ankle dorsiflexion    Ankle plantarflexion    Ankle inversion    Ankle eversion    (Blank rows = not tested)  BED MOBILITY:  {bed mobility:32615:p}  TRANSFERS: {transfers eval:32620}  RAMP:  {ramp eval:32616}  CURB:  {curb eval:32617}  STAIRS: {stairs eval:32618} GAIT: Findings: {GaitneuroPT:32644::Distance walked: ***,Comments: ***}  FUNCTIONAL TESTS:  {Functional tests:24029}  PATIENT SURVEYS:  {rehab surveys:24030}                                                                                                                               TREATMENT DATE: ***    PATIENT EDUCATION: Education details: *** Person educated: {Person educated:25204} Education method: {Education Method:25205} Education comprehension: {Education Comprehension:25206}  HOME EXERCISE PROGRAM: ***  GOALS: Goals reviewed with patient? Yes  SHORT TERM GOALS: Target date: ***  *** Baseline: Goal status: INITIAL  2.  *** Baseline:  Goal status: INITIAL  3.  *** Baseline:  Goal status: INITIAL  4.  *** Baseline:  Goal status: INITIAL  5.  *** Baseline:  Goal status: INITIAL  6.  *** Baseline:  Goal status: INITIAL  LONG TERM GOALS: Target date: ***  *** Baseline:  Goal status: INITIAL  2.  *** Baseline:  Goal status: INITIAL  3.  *** Baseline:  Goal status: INITIAL  4.  *** Baseline:  Goal status: INITIAL  5.  *** Baseline:  Goal status: INITIAL  6.  *** Baseline:  Goal status: INITIAL  ASSESSMENT:  CLINICAL IMPRESSION: Patient is a 38 year old male referred to Neuro OPPT for ataxia and internuclear ophthalmoplegia. Pt's PMH is significant for: etoh abuse, recent mandibular fx from trauma w/ internal fixation. The following deficits were present during the exam: ***. Based on ***, pt is an incr risk for falls. Pt would benefit from skilled PT to address these impairments and functional limitations to maximize functional mobility independence   OBJECTIVE IMPAIRMENTS: {opptimpairments:25111}.   ACTIVITY LIMITATIONS: {activitylimitations:27494}  PARTICIPATION LIMITATIONS: {participationrestrictions:25113}  PERSONAL FACTORS: {Personal factors:25162} are also affecting patient's functional outcome.   REHAB POTENTIAL: {rehabpotential:25112}  CLINICAL DECISION MAKING: {clinical decision making:25114}  EVALUATION COMPLEXITY: {Evaluation complexity:25115}  PLAN:  PT FREQUENCY: {rehab frequency:25116}  PT DURATION: {rehab duration:25117}  PLANNED INTERVENTIONS: {rehab planned  interventions:25118::97110-Therapeutic exercises,97530- Therapeutic [phone]- Neuromuscular re-education,97535- Self Rjmz,02859- Manual therapy,Patient/Family education}  PLAN FOR NEXT SESSION: ***   Marlon BRAVO Aidenjames Heckmann, PT, DPT 10/25/2024,  7:40 AM

## 2024-11-01 ENCOUNTER — Encounter: Payer: Self-pay | Admitting: Physical Therapy

## 2024-11-01 ENCOUNTER — Ambulatory Visit: Payer: MEDICAID | Attending: Internal Medicine | Admitting: Physical Therapy

## 2024-11-01 VITALS — BP 120/94 | HR 77

## 2024-11-01 DIAGNOSIS — R2689 Other abnormalities of gait and mobility: Secondary | ICD-10-CM | POA: Diagnosis present

## 2024-11-01 DIAGNOSIS — R270 Ataxia, unspecified: Secondary | ICD-10-CM | POA: Insufficient documentation

## 2024-11-01 DIAGNOSIS — H5123 Internuclear ophthalmoplegia, bilateral: Secondary | ICD-10-CM | POA: Insufficient documentation

## 2024-11-01 DIAGNOSIS — M6281 Muscle weakness (generalized): Secondary | ICD-10-CM | POA: Insufficient documentation

## 2024-11-01 DIAGNOSIS — R2681 Unsteadiness on feet: Secondary | ICD-10-CM | POA: Insufficient documentation

## 2024-11-01 DIAGNOSIS — R42 Dizziness and giddiness: Secondary | ICD-10-CM | POA: Insufficient documentation

## 2024-11-01 NOTE — Therapy (Signed)
 OUTPATIENT PHYSICAL THERAPY NEURO EVALUATION   Patient Name: [name] MRN: [id number] DOB:November 08, 1986, 38 y.o., male Today's Date: 11/01/2024   PCP: None REFERRING PROVIDER: Arlice Reichert, MD  END OF SESSION:  PT End of Session - 11/01/24 1539     Visit Number 1    Number of Visits 13    Date for Recertification  12/20/24    Authorization Type Self-Pay/Medicaid Pending    PT Start Time 1536   Pt arrived late   PT Stop Time 1605    PT Time Calculation (min) 29 min    Activity Tolerance Patient tolerated treatment well    Behavior During Therapy WFL for tasks assessed/performed          Past Medical History:  Diagnosis Date   Asthma    Past Surgical History:  Procedure Laterality Date   ORIF MANDIBULAR FRACTURE N/A 08/23/2024   Procedure: OPEN REDUCTION INTERNAL FIXATION (ORIF) MANDIBULAR FRACTURE;  Surgeon: Roark Rush, MD;  Location: [facility] OR;  Service: ENT;  Laterality: N/A;   Patient Active Problem List   Diagnosis Date Noted   Intractable nausea and vomiting 10/09/2024   Encounter for psychological evaluation 08/10/2023    ONSET DATE: 10/12/2024 (referral)   REFERRING DIAG: H51.23 (ICD-10-CM) - INO (internuclear ophthalmoplegia), bilateral R27.0 (ICD-10-CM) - Ataxia  THERAPY DIAG:  Unsteadiness on feet  Other abnormalities of gait and mobility  Muscle weakness (generalized)  Rationale for Evaluation and Treatment: Rehabilitation  SUBJECTIVE:                                                                                                                                                                                             SUBJECTIVE STATEMENT: Pt presents w/SPC. States his vision is problematic, sometimes sees double and vision is always blurry. Has had 2 falls since leaving the hospital due to vision impairments. Was able to get up on his own.    Pt accompanied by: self  PERTINENT HISTORY: etoh abuse, recent mandibular fx from trauma w/  internal fixation  PAIN:  Are you having pain? No Pt reports frequent pain in bilateral ankles   PRECAUTIONS: Fall  RED FLAGS: None   WEIGHT BEARING RESTRICTIONS: No  FALLS: Has patient fallen in last 6 months? Yes. Number of falls 4 (2 since leaving hospital)   LIVING ENVIRONMENT: Lives with: lives with their family Lives in: House/apartment Stairs: Yes: External: 8 steps; on right going up, on left going up, and can reach both Has following equipment at home: Single point cane and shower chair  PLOF: Requires assistive device for independence and Needs assistance with homemaking  PATIENT  GOALS: Just do the therapy you know and work on my legs and ankles   OBJECTIVE:  Note: Objective measures were completed at Evaluation unless otherwise noted.  DIAGNOSTIC FINDINGS: MRI of brain from 10/10/24  IMPRESSION: 1. Bithalamic hyperintense T2-weighted signal consistent with Wernicke encephalopathy.  CT maxillofacial from 10/08/24 IMPRESSION: 1. No acute facial fracture. 2. Subacute displaced right mandibular fracture at the junction of the condyle and ramus - minimal periosteal reaction. Plate and screw fixation of a subacute displaced and comminuted left mandibular body fracture. Plate and screw fixation of the maxilla and mandible, likely temporary.  COGNITION: Overall cognitive status: Impaired and Short term memory loss   SENSATION: Pt reports occasional numbness/tingling in BLEs  COORDINATION: Heel to shin test: WNL bilaterally No dysdiadochokinesia Finger to nose test: WNL bilaterally, vertical nystagmus noted throughout.    POSTURE: No Significant postural limitations  LOWER EXTREMITY ROM:     Active  Right Eval Left Eval  Hip flexion    Hip extension    Hip abduction    Hip adduction    Hip internal rotation    Hip external rotation    Knee flexion    Knee extension    Ankle dorsiflexion    Ankle plantarflexion    Ankle inversion    Ankle  eversion     (Blank rows = not tested)  LOWER EXTREMITY MMT:    MMT Right Eval Left Eval  Hip flexion    Hip extension    Hip abduction    Hip adduction    Hip internal rotation    Hip external rotation    Knee flexion    Knee extension    Ankle dorsiflexion    Ankle plantarflexion    Ankle inversion    Ankle eversion    (Blank rows = not tested)  BED MOBILITY:  Not tested Pt reports he often has difficulty getting out of bed, has to reach for something to pull himself up   TRANSFERS: Sit to stand: SBA  Assistive device utilized: Single point cane     Stand to sit: SBA  Assistive device utilized: Single point cane      RAMP:  Not tested  CURB:  Not tested  STAIRS: Not tested GAIT: Gait pattern: {gait characteristics:25376} Distance walked: *** Assistive device utilized: Single point cane Level of assistance: CGA Comments: ***   FUNCTIONAL TESTS:   [facility] PT Assessment - 11/01/24 1555       Transfers   Five time sit to stand comments  15.01s   LUE support on armrest and RUE support on SPC           VITALS  Vitals:   11/01/24 1548 11/01/24 1551  BP: (!) 122/104 (!) 120/94  Pulse: 74 77  TREATMENT:   Self-care/home management  Assessed vitals (see above) and diastolic BP elevated. Pt reports he does not have a PCP but per chart review, has initial consult w/FNP in January.  Discussed plan to focus on vestibular therapy as his vision is impacting his balance. Pt in agreement with this    PATIENT EDUCATION: Education details: *** Person educated: Patient Education method: Explanation Education comprehension: verbalized understanding and needs further education  HOME EXERCISE PROGRAM: To be established   GOALS: Goals reviewed with patient? Yes  SHORT TERM GOALS: Target date: 11/29/2024   *** Baseline: Goal  status: INITIAL  2.  *** Baseline:  Goal status: INITIAL  3.  *** Baseline:  Goal status: INITIAL  4.  *** Baseline:  Goal status: INITIAL  5.  *** Baseline:  Goal status: INITIAL  6.  *** Baseline:  Goal status: INITIAL  LONG TERM GOALS: Target date: 12/13/2024   *** Baseline:  Goal status: INITIAL  2.  *** Baseline:  Goal status: INITIAL  3.  *** Baseline:  Goal status: INITIAL  4.  *** Baseline:  Goal status: INITIAL  5.  *** Baseline:  Goal status: INITIAL  6.  *** Baseline:  Goal status: INITIAL  ASSESSMENT:  CLINICAL IMPRESSION: Patient is a 38 year old male referred to Neuro OPPT for ataxia and internuclear ophthalmoplegia. Pt's PMH is significant for: etoh abuse, recent mandibular fx from trauma w/ internal fixation. The following deficits were present during the exam: ***. Based on ***, pt is an incr risk for falls. Pt would benefit from skilled PT to address these impairments and functional limitations to maximize functional mobility independence   OBJECTIVE IMPAIRMENTS: {opptimpairments:25111}.   ACTIVITY LIMITATIONS: {activitylimitations:27494}  PARTICIPATION LIMITATIONS: {participationrestrictions:25113}  PERSONAL FACTORS: {Personal factors:25162} are also affecting patient's functional outcome.   REHAB POTENTIAL: {rehabpotential:25112}  CLINICAL DECISION MAKING: Unstable/unpredictable  EVALUATION COMPLEXITY: High  PLAN:  PT FREQUENCY: 1-2x/week  PT DURATION: 6 weeks  PLANNED INTERVENTIONS: 02835- PT Re-evaluation, 97750- Physical Performance Testing, 97110-Therapeutic exercises, 97530- Therapeutic activity, [id number]- Neuromuscular re-education, 97535- Self Care, 02859- Manual therapy, [phone]- Gait training, [phone]- Canalith repositioning, [id number]- Aquatic Therapy, [phone]- Electrical stimulation (manual), [phone] (1-2 muscles), 20561 (3+ muscles)- Dry Needling, Patient/Family education, Balance training, Stair training, Joint mobilization,  Spinal mobilization, Vestibular training, Visual/preceptual remediation/compensation, Cognitive remediation, and DME instructions  PLAN FOR NEXT SESSION: ***   Edison Nicholson E Ranelle Auker, PT, DPT 11/01/2024, 4:11 PM

## 2024-11-09 ENCOUNTER — Telehealth: Payer: Self-pay

## 2024-11-09 ENCOUNTER — Ambulatory Visit: Payer: MEDICAID

## 2024-11-09 DIAGNOSIS — R2681 Unsteadiness on feet: Secondary | ICD-10-CM

## 2024-11-09 DIAGNOSIS — E512 Wernicke's encephalopathy: Secondary | ICD-10-CM

## 2024-11-09 DIAGNOSIS — R42 Dizziness and giddiness: Secondary | ICD-10-CM

## 2024-11-09 DIAGNOSIS — R2689 Other abnormalities of gait and mobility: Secondary | ICD-10-CM

## 2024-11-09 DIAGNOSIS — M6281 Muscle weakness (generalized): Secondary | ICD-10-CM

## 2024-11-09 NOTE — Telephone Encounter (Signed)
 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 He does not currently have a PCP and is not scheduled to see one until January to establish care.   Can you please place a referral for neurology and neuro-ophthalmology?   Thank you,   Delon DELENA Pop, PT, DPT, CBIS, CFVRS

## 2024-11-09 NOTE — Therapy (Signed)
 OUTPATIENT PHYSICAL THERAPY NEURO TREATMENT   Patient Name: [name] MRN: [id number] DOB:Dec 16, 1986, 38 y.o., male Today's Date: 11/09/2024   PCP: None REFERRING PROVIDER: Arlice Reichert, MD  END OF SESSION:  PT End of Session - 11/09/24 1354     Visit Number 2    Number of Visits 13    Date for Recertification  12/20/24    Authorization Type Self-Pay/Medicaid Pending    PT Start Time 1403    PT Stop Time 1447    PT Time Calculation (min) 44 min    Activity Tolerance Patient tolerated treatment well    Behavior During Therapy [facility] for tasks assessed/performed          Past Medical History:  Diagnosis Date   Asthma    Past Surgical History:  Procedure Laterality Date   ORIF MANDIBULAR FRACTURE N/A 08/23/2024   Procedure: OPEN REDUCTION INTERNAL FIXATION (ORIF) MANDIBULAR FRACTURE;  Surgeon: Roark Rush, MD;  Location: [facility] OR;  Service: ENT;  Laterality: N/A;   Patient Active Problem List   Diagnosis Date Noted   Intractable nausea and vomiting 10/09/2024   Encounter for psychological evaluation 08/10/2023    ONSET DATE: 10/12/2024 (referral)   REFERRING DIAG: H51.23 (ICD-10-CM) - INO (internuclear ophthalmoplegia), bilateral R27.0 (ICD-10-CM) - Ataxia  THERAPY DIAG:  Unsteadiness on feet  Other abnormalities of gait and mobility  Muscle weakness (generalized)  Dizziness and giddiness  Rationale for Evaluation and Treatment: Rehabilitation  SUBJECTIVE:                                                                                                                                                                                             SUBJECTIVE STATEMENT: Patient arrives to clinic alone, using SPC. He endorses eye strain requesting something wet to put over them. Presents with persistent vertical nystagmus. Denies falls.    Pt accompanied by: self  PERTINENT HISTORY: etoh abuse, recent mandibular fx from trauma w/ internal fixation,  Wernicke's Encephalopathy   PAIN:  Are you having pain? No Pt reports frequent pain in bilateral ankles   PRECAUTIONS: Fall  PATIENT GOALS: Just do the therapy you know and work on my legs and ankles   OBJECTIVE:  Note: Objective measures were completed at Evaluation unless otherwise noted.  DIAGNOSTIC FINDINGS: MRI of brain from 10/10/24  IMPRESSION: 1. Bithalamic hyperintense T2-weighted signal consistent with Wernicke encephalopathy.  CT maxillofacial from 10/08/24 IMPRESSION: 1. No acute facial fracture. 2. Subacute displaced right mandibular fracture at the junction of the condyle and ramus - minimal periosteal reaction. Plate and screw fixation of a subacute displaced and comminuted left mandibular body  fracture. Plate and screw fixation of the maxilla and mandible, likely temporary.    VITALS  There were no vitals filed for this visit.                                                                                                                                TREATMENT:   NMR: -monocular saccade strips set 4 apart and 75ft in front of patient   -moved into dark room to assess for decreased visual strain and impact on vertical nystagmus  -monocular fixation to static target -unable to assess oculomotor skills given persistent vertical nystagmus  -PT unsure whether and to what extent patient can track  -PT encouraging prompt f/u with neuro and neuro-ophthalmology    PATIENT EDUCATION: Education details: exam findings, see above Person educated: Patient Education method: Explanation Education comprehension: verbalized understanding and needs further education  HOME EXERCISE PROGRAM: -static fixation to target placed on plain wall ~78ft in front  GOALS: Goals reviewed with patient? Yes  SHORT TERM GOALS: Target date: 11/29/2024   Pt will be independent with initial HEP for improved strength, balance, transfers and gait.  Baseline: not established on  eval  Goal status: INITIAL  2.  to be assessed and STG/LTG updated  Baseline:  Goal status: INITIAL  3.  MCTSIB to be assessed and STG/LTG updated  Baseline:  Goal status: INITIAL  4.  FGA to be assessed and STG/LTG updated  Baseline:  Goal status: INITIAL    LONG TERM GOALS: Target date: 12/13/2024   Pt will be independent with final HEP for improved strength, balance, transfers and gait.  Baseline:  Goal status: INITIAL  2.  goal  Baseline:  Goal status: INITIAL  3.  MCTSIB goal  Baseline:  Goal status: INITIAL  4.  FGA goal  Baseline:  Goal status: INITIAL  5.  Pt will improve 5 x STS to less than or equal to 12 seconds w/reduced UE support to demonstrate improved functional strength and transfer efficiency.   Baseline: 15.01s w/BUE support Goal status: INITIAL   ASSESSMENT:  CLINICAL IMPRESSION: Patient seen for skilled PT session with emphasis on visual assessment and intervention. Vertical nystagmus did not suppress with fixation. Diplopia did not resolve with monocular patching. Attempted short distance saccades both monocularly and binocularly. Patient with preferential cervical extension, though this compensation did not significantly improve his vision. Once in dark room, vertical nystagmus did reduce, but did not go away completely. Extensive discussion with patient re: importance of medical management of case and need for prompt f/u with neuro. PT unsure if vision symptoms will improve as B1 levels normalize. As well, his functional balance will continue to suffer so long as his vision remains as impaired as it is. Continue POC.    OBJECTIVE IMPAIRMENTS: Abnormal gait, decreased activity tolerance, decreased balance, decreased cognition, decreased coordination, decreased knowledge of condition, decreased knowledge of use of DME, decreased mobility, difficulty walking,  decreased strength, decreased safety awareness, impaired sensation, impaired  vision/preception, and improper body mechanics  ACTIVITY LIMITATIONS: carrying, lifting, bending, standing, squatting, stairs, transfers, bed mobility, locomotion level, and caring for others  PARTICIPATION LIMITATIONS: meal prep, cleaning, laundry, interpersonal relationship, driving, shopping, community activity, occupation, and yard work  PERSONAL FACTORS: Past/current experiences, Transportation, and 1 comorbidity: Wernicke's Encephalopathy are also affecting patient's functional outcome.   REHAB POTENTIAL: Good  CLINICAL DECISION MAKING: Unstable/unpredictable  EVALUATION COMPLEXITY: High  PLAN:  PT FREQUENCY: 1-2x/week  PT DURATION: 6 weeks  PLANNED INTERVENTIONS: 02835- PT Re-evaluation, 97750- Physical Performance Testing, 97110-Therapeutic exercises, 97530- Therapeutic activity, [id number]- Neuromuscular re-education, 97535- Self Care, 02859- Manual therapy, [id number]- Gait training, [phone]- Canalith repositioning, [id number]- Aquatic Therapy, [phone]- Electrical stimulation (manual), [phone] (1-2 muscles), [id number] (3+ muscles)- Dry Needling, Patient/Family education, Balance training, Stair training, Joint mobilization, Spinal mobilization, Vestibular training, Visual/preceptual remediation/compensation, Cognitive remediation, and DME instructions  PLAN FOR NEXT SESSION: Check vitals. Assess , MCTSIB and FGA and update goals. Assess vision/vestibular system. Establish HEP based on deficits highlighted by OM.   Did he get an appt with neuro? Static fixation on target on plain background. Very short saccades, likely monocularly. Did do slightly better in dark room in terms of vertical nystagmus   Delon DELENA Pop, PT Delon DELENA Pop, PT, DPT, CBIS, CFVRS  11/09/2024, 3:04 PM

## 2024-11-11 ENCOUNTER — Telehealth: Payer: Self-pay

## 2024-11-11 ENCOUNTER — Ambulatory Visit: Payer: MEDICAID | Admitting: Physical Therapy

## 2024-11-11 VITALS — BP 119/79 | HR 78

## 2024-11-11 DIAGNOSIS — R2681 Unsteadiness on feet: Secondary | ICD-10-CM | POA: Diagnosis not present

## 2024-11-11 DIAGNOSIS — M6281 Muscle weakness (generalized): Secondary | ICD-10-CM

## 2024-11-11 DIAGNOSIS — R2689 Other abnormalities of gait and mobility: Secondary | ICD-10-CM

## 2024-11-11 DIAGNOSIS — R42 Dizziness and giddiness: Secondary | ICD-10-CM

## 2024-11-11 NOTE — Telephone Encounter (Signed)
 Copied from CRM [id number] [id number]. Topic: Referral - Question >> Nov 11, 2024 10:01 AM Laymon HERO wrote: Reason for RMF:Wzzipwh to add to the neurology referral also a referral to neuro-ophthalmology  Pt has new pt appt with you 12/30/24. Please advise . Kh

## 2024-11-11 NOTE — Therapy (Signed)
 OUTPATIENT PHYSICAL THERAPY NEURO TREATMENT   Patient Name: [name] MRN: [id number] DOB:[birth year], 38 y.o., male Today's Date: 11/11/2024   PCP: None REFERRING PROVIDER: Arlice Reichert, MD  END OF SESSION:  PT End of Session - 11/11/24 1452     Visit Number 3    Number of Visits 13    Date for Recertification  12/20/24    Authorization Type Self-Pay/Medicaid Pending    PT Start Time 1449    PT Stop Time 1530    PT Time Calculation (min) 41 min    Equipment Utilized During Treatment Gait belt    Activity Tolerance Patient tolerated treatment well    Behavior During Therapy WFL for tasks assessed/performed          Past Medical History:  Diagnosis Date   Asthma    Past Surgical History:  Procedure Laterality Date   ORIF MANDIBULAR FRACTURE N/A 08/23/2024   Procedure: OPEN REDUCTION INTERNAL FIXATION (ORIF) MANDIBULAR FRACTURE;  Surgeon: Roark Rush, MD;  Location: [facility] OR;  Service: ENT;  Laterality: N/A;   Patient Active Problem List   Diagnosis Date Noted   Intractable nausea and vomiting 10/09/2024   Encounter for psychological evaluation 08/10/2023    ONSET DATE: 10/12/2024 (referral)   REFERRING DIAG: H51.23 (ICD-10-CM) - INO (internuclear ophthalmoplegia), bilateral R27.0 (ICD-10-CM) - Ataxia  THERAPY DIAG:  Unsteadiness on feet  Other abnormalities of gait and mobility  Muscle weakness (generalized)  Dizziness and giddiness  Rationale for Evaluation and Treatment: Rehabilitation  SUBJECTIVE:                                                                                                                                                                                             SUBJECTIVE STATEMENT: Patient arrives to clinic alone, using SPC. Has not heard about neurologist appt (referral was just put in). Presents with persistent vertical nystagmus. Notes no changes with his vision. Dizziness comes and goes, feels like it is at least a 5/10.  Has been taking his medications (thiamine ), but notices no differences. Reports that the more he looked at the X, the more he felt his vision got worse. Has to check on his Medicaid pending status. Asking about doing squats for his balance and strength. Pt reporting seeing double with bright lights.    Pt accompanied by: self  PERTINENT HISTORY: etoh abuse, recent mandibular fx from trauma w/ internal fixation, Wernicke's Encephalopathy   PAIN:  Are you having pain? No Has some pain in the back of his legs thinks its from being dizzy and walking   PRECAUTIONS: Fall  PATIENT GOALS: Just do the  therapy you know and work on my legs and ankles   OBJECTIVE:  Note: Objective measures were completed at Evaluation unless otherwise noted.  DIAGNOSTIC FINDINGS: MRI of brain from 10/10/24  IMPRESSION: 1. Bithalamic hyperintense T2-weighted signal consistent with Wernicke encephalopathy.  CT maxillofacial from 10/08/24 IMPRESSION: 1. No acute facial fracture. 2. Subacute displaced right mandibular fracture at the junction of the condyle and ramus - minimal periosteal reaction. Plate and screw fixation of a subacute displaced and comminuted left mandibular body fracture. Plate and screw fixation of the maxilla and mandible, likely temporary.    VITALS  Vitals:   11/11/24 1505  BP: 119/79  Pulse: 78                                                                                                                                  TREATMENT:   Self-Care:  Vitals:   11/11/24 1505  BP: 119/79  Pulse: 78   Performed session in a dimly lit room as that felt better for pt's vision and less eye strain compared to being in bright treatment gym   Educated on impact vision has on balance Went over calling neurologist to make an appt  Therapeutic Activity:   VESTIBULAR ASSESSMENT   GENERAL OBSERVATION: Ambulates in with SPC    OCULOMOTOR EXAM:   Ocular Alignment: abnormal and  persistent vertical nystagmus at rest    Ocular ROM: limited, pt compensates with turning his head    Spontaneous Nystagmus: persistent vertical nystagmus   Gaze-Induced Nystagmus: persistent vertical nystagmus   Smooth Pursuits: saccades and pt with vertical nystagmus throughout however able to track, most challenged in vertical direction, limited ROM    Saccades: extra eye movements and vertical nystagmus, more challenged in vertical direction     Gait speed with SPC: 23.5 seconds = 1.40 ft/sec    M-CTSIB  Condition 1: Firm Surface, EO 30 Sec, Normal Sway  Condition 2: Firm Surface, EC 30 Sec, Mild Sway  Condition 3: Foam Surface, EO 30 Sec, Mild Sway  Condition 4: Foam Surface, EC 20 Sec, Mild Sway, pt reporting dizziness and unsteadiness     Smooth pursuits 5 reps horizontal each side 5 reps vertical  Added above to HEP Reviewed exercise added at last session about keeping eyes focused on a target    Access Code: [email or web address] URL: [email or web address] Date: 11/11/2024 Prepared by: Sheffield Senate  Exercises - Seated Horizontal Smooth Pursuit  - 1 x daily - 7 x weekly - 2 sets - 5 reps - Seated Vertical Smooth Pursuit  - 1 x daily - 7 x weekly - 2 sets - 5 reps  Pt requesting a cold washcloth to put over his eyes are that helps him see clearer    PATIENT EDUCATION: Education details: See above  Person educated: Patient Education method: Explanation Education comprehension: verbalized understanding and needs further education  HOME EXERCISE PROGRAM: -static fixation to target placed on plain  wall ~61ft in front  Access Code: [email or web address] URL: [email or web address] Date: 11/11/2024 Prepared by: Sheffield Senate  Exercises - Seated Horizontal Smooth Pursuit  - 1 x daily - 7 x weekly - 2 sets - 5 reps - Seated Vertical Smooth Pursuit  - 1 x daily - 7 x weekly - 2 sets - 5 reps  GOALS: Goals reviewed with patient? Yes  SHORT TERM GOALS:  Target date: 11/29/2024   Pt will be independent with initial HEP for improved strength, balance, transfers and gait.  Baseline: not established on eval  Goal status: INITIAL  2.  to be assessed and STG/LTG updated  Baseline:  Goal status: INITIAL  3.  MCTSIB to be assessed and STG/LTG updated  Baseline:  Goal status: INITIAL  4.  FGA to be assessed and STG/LTG updated  Baseline:  Goal status: INITIAL    LONG TERM GOALS: Target date: 12/13/2024   Pt will be independent with final HEP for improved strength, balance, transfers and gait.  Baseline:  Goal status: INITIAL  2.  goal  Baseline:  Goal status: INITIAL  3.  MCTSIB goal  Baseline:  Goal status: INITIAL  4.  FGA goal  Baseline:  Goal status: INITIAL  5.  Pt will improve 5 x STS to less than or equal to 12 seconds w/reduced UE support to demonstrate improved functional strength and transfer efficiency.   Baseline: 15.01s w/BUE support Goal status: INITIAL   ASSESSMENT:  CLINICAL IMPRESSION: Patient seen for skilled PT session with emphasis on visual assessment and intervention. Vertical nystagmus did not suppress with fixation. Diplopia did not resolve with monocular patching. Attempted short distance saccades both monocularly and binocularly. Patient with preferential cervical extension, though this compensation did not significantly improve his vision. Once in dark room, vertical nystagmus did reduce, but did not go away completely. Extensive discussion with patient re: importance of medical management of case and need for prompt f/u with neuro. PT unsure if vision symptoms will improve as B1 levels normalize. As well, his functional balance will continue to suffer so long as his vision remains as impaired as it is. Continue POC.    OBJECTIVE IMPAIRMENTS: Abnormal gait, decreased activity tolerance, decreased balance, decreased cognition, decreased coordination, decreased knowledge of condition,  decreased knowledge of use of DME, decreased mobility, difficulty walking, decreased strength, decreased safety awareness, impaired sensation, impaired vision/preception, and improper body mechanics  ACTIVITY LIMITATIONS: carrying, lifting, bending, standing, squatting, stairs, transfers, bed mobility, locomotion level, and caring for others  PARTICIPATION LIMITATIONS: meal prep, cleaning, laundry, interpersonal relationship, driving, shopping, community activity, occupation, and yard work  PERSONAL FACTORS: Past/current experiences, Transportation, and 1 comorbidity: Wernicke's Encephalopathy are also affecting patient's functional outcome.   REHAB POTENTIAL: Good  CLINICAL DECISION MAKING: Unstable/unpredictable  EVALUATION COMPLEXITY: High  PLAN:  PT FREQUENCY: 1-2x/week  PT DURATION: 6 weeks  PLANNED INTERVENTIONS: 02835- PT Re-evaluation, 97750- Physical Performance Testing, 97110-Therapeutic exercises, 97530- Therapeutic activity, [id number]- Neuromuscular re-education, 97535- Self Care, 02859- Manual therapy, [id number]- Gait training, [phone]- Canalith repositioning, [id number]- Aquatic Therapy, [phone]- Electrical stimulation (manual), [phone] (1-2 muscles), [phone] (3+ muscles)- Dry Needling, Patient/Family education, Balance training, Stair training, Joint mobilization, Spinal mobilization, Vestibular training, Visual/preceptual remediation/compensation, Cognitive remediation, and DME instructions  PLAN FOR NEXT SESSION: Check vitals. Assess FGA when able and update goal?  Did he get an appt with neuro? Static fixation on target on plain background. Very short saccades, likely monocularly. Did do slightly better in dark room in terms of vertical nystagmus  Sheffield LOISE Senate, PT, DPT  11/11/2024, 4:20 PM

## 2024-11-14 ENCOUNTER — Ambulatory Visit: Payer: MEDICAID | Admitting: Physical Therapy

## 2024-11-14 ENCOUNTER — Encounter: Payer: Self-pay | Admitting: Physical Therapy

## 2024-11-14 VITALS — BP 109/84 | HR 93

## 2024-11-14 DIAGNOSIS — R2681 Unsteadiness on feet: Secondary | ICD-10-CM | POA: Diagnosis not present

## 2024-11-14 DIAGNOSIS — M6281 Muscle weakness (generalized): Secondary | ICD-10-CM

## 2024-11-14 DIAGNOSIS — R42 Dizziness and giddiness: Secondary | ICD-10-CM

## 2024-11-14 DIAGNOSIS — R2689 Other abnormalities of gait and mobility: Secondary | ICD-10-CM

## 2024-11-14 NOTE — Therapy (Signed)
 OUTPATIENT PHYSICAL THERAPY NEURO TREATMENT   Patient Name: [name] MRN: [id number] DOB:[birth year], 38 y.o., male Today's Date: 11/14/2024   PCP: None REFERRING PROVIDER: Arlice Reichert, MD  END OF SESSION:  PT End of Session - 11/14/24 1453     Visit Number 4    Number of Visits 13    Date for Recertification  12/20/24    Authorization Type Self-Pay/Medicaid Pending    PT Start Time 1447    PT Stop Time 1529    PT Time Calculation (min) 42 min    Equipment Utilized During Treatment Gait belt    Activity Tolerance Patient tolerated treatment well    Behavior During Therapy WFL for tasks assessed/performed          Past Medical History:  Diagnosis Date   Asthma    Past Surgical History:  Procedure Laterality Date   ORIF MANDIBULAR FRACTURE N/A 08/23/2024   Procedure: OPEN REDUCTION INTERNAL FIXATION (ORIF) MANDIBULAR FRACTURE;  Surgeon: Roark Rush, MD;  Location: [facility] OR;  Service: ENT;  Laterality: N/A;   Patient Active Problem List   Diagnosis Date Noted   Intractable nausea and vomiting 10/09/2024   Encounter for psychological evaluation 08/10/2023    ONSET DATE: 10/12/2024 (referral)   REFERRING DIAG: H51.23 (ICD-10-CM) - INO (internuclear ophthalmoplegia), bilateral R27.0 (ICD-10-CM) - Ataxia  THERAPY DIAG:  Unsteadiness on feet  Other abnormalities of gait and mobility  Muscle weakness (generalized)  Dizziness and giddiness  Rationale for Evaluation and Treatment: Rehabilitation  SUBJECTIVE:                                                                                                                                                                                             SUBJECTIVE STATEMENT: Have not yet called Medicaid to figure out his status. Will try again today. PT walked pt over to neurology appt next door to get scheduled for urgent neurology appt. Pt reports that they have no appts this year and did not get scheduled. Reports  they will call him if something opens up sooner. No falls or anything since he was last here. Did have a fall, but fell right on the couch. Currently living at his sister's house. Reports can see a lot better in the nighttime.    Pt accompanied by: self  PERTINENT HISTORY: etoh abuse, recent mandibular fx from trauma w/ internal fixation, Wernicke's Encephalopathy   PAIN:  Are you having pain? No Has some pain in the back of his legs thinks its from being dizzy and walking   PRECAUTIONS: Fall  PATIENT GOALS: Just do the therapy you know and  work on my legs and ankles   OBJECTIVE:  Note: Objective measures were completed at Evaluation unless otherwise noted.  DIAGNOSTIC FINDINGS: MRI of brain from 10/10/24  IMPRESSION: 1. Bithalamic hyperintense T2-weighted signal consistent with Wernicke encephalopathy.  CT maxillofacial from 10/08/24 IMPRESSION: 1. No acute facial fracture. 2. Subacute displaced right mandibular fracture at the junction of the condyle and ramus - minimal periosteal reaction. Plate and screw fixation of a subacute displaced and comminuted left mandibular body fracture. Plate and screw fixation of the maxilla and mandible, likely temporary.    VITALS  Vitals:   11/14/24 1501  BP: 109/84  Pulse: 93                                                                                                                                   TREATMENT:   Self-Care:  Vitals:   11/14/24 1501  BP: 109/84  Pulse: 93    Performed session in a dimly lit room as that felt better for pt's vision and less eye strain compared to being in bright treatment gym and provided pt with damp washcloth per pt request to help with his eyes.   Provided pt with other neurologist office in [place] through [place]   Educated on impact that pt's vision/persistent nystagmus has on pt's balance  Went over calling neurologist to make an appt (referral has been put in, but pt has not  yet heard back from office), provided handout with neurologist's phone number. Also educated that pt should follow-up with PCP about seeing them earlier (as appt is not until January), PT called pt's PCP office earlier today regarding being seen earlier regarding pt's diagnosis and visual deficits/vertical nystagmus, but also recommended that pt give a call as well.   NMR:   Seated Saccades using Popsicle sticks  In Horizontal direction with both eyes: 2 x 10 reps, 3-4/10 dizziness  Pt reports he can see better out of his R eye With R eye: 10 reps (no dizziness), With L eye: 10 reps (no dizziness)  In Vertical direction: 2 x 10 reps, pt reports not really having dizziness  Incr difficulty in superior direction  With L eye: 10 reps (pt reports incr difficulty), with R eye:   Slow VOR: Horizontal direction: 2 x 5 reps each side   Persistent vertical nystagmus throughout all exercises, pt holds head in a slightly extended position with exercises    PATIENT EDUCATION: Education details: See above  Person educated: Patient Education method: Explanation Education comprehension: verbalized understanding and needs further education  HOME EXERCISE PROGRAM: -static fixation to target placed on plain wall ~82ft in front  Access Code: [email or web address] URL: [email or web address] Date: 11/14/2024 Prepared by: Sheffield Senate  Exercises - Seated Horizontal Smooth Pursuit  - 1 x daily - 7 x weekly - 2 sets - 5 reps - Seated Vertical Smooth Pursuit  - 1 x daily - 7 x weekly - 2 sets -  5 reps - Seated Horizontal Saccades  - 1 x daily - 7 x weekly - 2 sets - 10 reps (one eye, and both eyes) - Seated Vertical Saccades  - 1 x daily - 7 x weekly - 2 sets - 10 reps(one eye, and both eyes)  GOALS: Goals reviewed with patient? Yes  SHORT TERM GOALS: Target date: 11/29/2024   Pt will be independent with initial HEP for improved strength, balance, transfers and gait.  Baseline: not  established on eval  Goal status: INITIAL  2.  Pt will improve gait speed with SPC to at least 1.8 ft/sec in order to demo decr fall risk.   Baseline: with SPC: 23.5 seconds = 1.40 ft/sec Goal status: INITIAL  3.  MCTSIB to be assessed and STG/LTG updated  Baseline: LTG written  Goal status: MET  4.  FGA to be assessed and STG/LTG updated  Baseline:  Goal status: INITIAL    LONG TERM GOALS: Target date: 12/13/2024   Pt will be independent with final HEP for improved strength, balance, transfers and gait.  Baseline:  Goal status: INITIAL  2.  Pt will improve gait speed with SPC to at least 2.2 ft/sec in order to demo improved community mobility. Baseline: with SPC: 23.5 seconds = 1.40 ft/sec Goal status: INITIAL  3.  Pt will improve condition 4 of mCTSIB to at least 30 seconds in order to demo improved vestibular system for balance.  Baseline: 20 Sec, Mild Sway, pt reporting dizziness and unsteadiness Goal status: INITIAL  4.  FGA goal  Baseline:  Goal status: INITIAL  5.  Pt will improve 5 x STS to less than or equal to 12 seconds w/reduced UE support to demonstrate improved functional strength and transfer efficiency.   Baseline: 15.01s w/BUE support Goal status: INITIAL   ASSESSMENT:  CLINICAL IMPRESSION: Pt's BP WFL for today's session. Today's skilled session focused on further oculomotor and outcome measure assessment. Since last session, pt has received a referral to neurology, which PT encouraged pt to reach out and try to make an appt (however office was closed on Friday). PT also contacted pt's PCP office earlier today for further medical management for Wernicke's encephalopathy, and also encouraged pt to reach out to see if he can also be seen sooner than January. Continued discussion regarding importance follow-up of medical management. Assessed mCTSIB today with pt only able to hold condition 4 for 20 seconds indicating impaired vestibular input for balance.  LTG updated. Also assessed oculomotor tasks with pt with vertical spontaneous nystagmus throughout, pt most challenged by saccades and smooth pursuits in vertical superior direction. Pt's prognosis with PT is limited until pt receives further medical management of his case and pt's balance will continued to be impaired due to constant vertical nystagmus. Will continue per POC able.    OBJECTIVE IMPAIRMENTS: Abnormal gait, decreased activity tolerance, decreased balance, decreased cognition, decreased coordination, decreased knowledge of condition, decreased knowledge of use of DME, decreased mobility, difficulty walking, decreased strength, decreased safety awareness, impaired sensation, impaired vision/preception, and improper body mechanics  ACTIVITY LIMITATIONS: carrying, lifting, bending, standing, squatting, stairs, transfers, bed mobility, locomotion level, and caring for others  PARTICIPATION LIMITATIONS: meal prep, cleaning, laundry, interpersonal relationship, driving, shopping, community activity, occupation, and yard work  PERSONAL FACTORS: Past/current experiences, Transportation, and 1 comorbidity: Wernicke's Encephalopathy are also affecting patient's functional outcome.   REHAB POTENTIAL: Good  CLINICAL DECISION MAKING: Unstable/unpredictable  EVALUATION COMPLEXITY: High  PLAN:  PT FREQUENCY: 1-2x/week  PT DURATION: 6  weeks  PLANNED INTERVENTIONS: 97164- PT Re-evaluation, 97750- Physical Performance Testing, 97110-Therapeutic exercises, 97530- Therapeutic activity, [id number]- Neuromuscular re-education, [phone]- Self Care, 02859- Manual therapy, [phone]- Gait training, [phone]- Canalith repositioning, [id number]- Aquatic Therapy, [phone]- Electrical stimulation (manual), [phone] (1-2 muscles), [phone] (3+ muscles)- Dry Needling, Patient/Family education, Balance training, Stair training, Joint mobilization, Spinal mobilization, Vestibular training, Visual/preceptual remediation/compensation,  Cognitive remediation, and DME instructions  PLAN FOR NEXT SESSION: Check vitals. Assess FGA when able and update goal? Drop down frequency to 1x a week? Have really tried to get pt in to see PCP or neurologist earlier but has had no luck. Any update on neurologist appt?   Static fixation on target on plain background. Very short saccades, likely monocularly. Did do slightly better in dark room in terms of vertical nystagmus. Continue oculomotor exercises as appropriate. Have not been able to find any null point, has consistent vertical nystagmus. Work on balance with EC on foam as that was challenging per mCTSIB    Sheffield LOISE Senate, PT, DPT  11/14/2024, 4:41 PM

## 2024-11-15 ENCOUNTER — Ambulatory Visit: Payer: MEDICAID | Admitting: Neurology

## 2024-11-23 ENCOUNTER — Ambulatory Visit: Payer: MEDICAID

## 2024-11-23 VITALS — BP 110/70 | HR 95

## 2024-11-23 DIAGNOSIS — R42 Dizziness and giddiness: Secondary | ICD-10-CM | POA: Diagnosis present

## 2024-11-23 DIAGNOSIS — R2681 Unsteadiness on feet: Secondary | ICD-10-CM | POA: Insufficient documentation

## 2024-11-23 DIAGNOSIS — M6281 Muscle weakness (generalized): Secondary | ICD-10-CM | POA: Insufficient documentation

## 2024-11-23 DIAGNOSIS — R2689 Other abnormalities of gait and mobility: Secondary | ICD-10-CM | POA: Insufficient documentation

## 2024-11-23 NOTE — Therapy (Signed)
 OUTPATIENT PHYSICAL THERAPY NEURO TREATMENT   Patient Name: [name] MRN: [id number] DOB:1986-03-17, 38 y.o., male Today's Date: 11/23/2024   PCP: None REFERRING PROVIDER: Arlice Reichert, MD  END OF SESSION:  PT End of Session - 11/23/24 1352     Visit Number 5    Number of Visits 13    Date for Recertification  12/20/24    Authorization Type Self-Pay/Medicaid Pending    PT Start Time 1354    PT Stop Time 1450    PT Time Calculation (min) 56 min    Activity Tolerance Patient tolerated treatment well    Behavior During Therapy WFL for tasks assessed/performed          Past Medical History:  Diagnosis Date   Asthma    Past Surgical History:  Procedure Laterality Date   ORIF MANDIBULAR FRACTURE N/A 08/23/2024   Procedure: OPEN REDUCTION INTERNAL FIXATION (ORIF) MANDIBULAR FRACTURE;  Surgeon: Roark Rush, MD;  Location: [facility] OR;  Service: ENT;  Laterality: N/A;   Patient Active Problem List   Diagnosis Date Noted   Intractable nausea and vomiting 10/09/2024   Encounter for psychological evaluation 08/10/2023    ONSET DATE: 10/12/2024 (referral)   REFERRING DIAG: H51.23 (ICD-10-CM) - INO (internuclear ophthalmoplegia), bilateral R27.0 (ICD-10-CM) - Ataxia  THERAPY DIAG:  Unsteadiness on feet  Other abnormalities of gait and mobility  Muscle weakness (generalized)  Dizziness and giddiness  Rationale for Evaluation and Treatment: Rehabilitation  SUBJECTIVE:                                                                                                                                                                                             SUBJECTIVE STATEMENT: Patient arrives to clinic with mom. Has not touched based with Medicaid yet. Has not heard from new PCP about moving appt to sooner. Did lose his balance while walking and landed on his knee- no injury.    Pt accompanied by: self  PERTINENT HISTORY: etoh abuse, recent mandibular fx from trauma  w/ internal fixation, Wernicke's Encephalopathy   PAIN:  Are you having pain? No Has some pain in the back of his legs thinks its from being dizzy and walking   PRECAUTIONS: Fall  PATIENT GOALS: Just do the therapy you know and work on my legs and ankles   OBJECTIVE:  Note: Objective measures were completed at Evaluation unless otherwise noted.  DIAGNOSTIC FINDINGS: MRI of brain from 10/10/24  IMPRESSION: 1. Bithalamic hyperintense T2-weighted signal consistent with Wernicke encephalopathy.  CT maxillofacial from 10/08/24 IMPRESSION: 1. No acute facial fracture. 2. Subacute displaced right mandibular fracture at the junction of  the condyle and ramus - minimal periosteal reaction. Plate and screw fixation of a subacute displaced and comminuted left mandibular body fracture. Plate and screw fixation of the maxilla and mandible, likely temporary.    VITALS  Vitals:   11/23/24 1427  BP: 110/70  Pulse: 95                                                                                                                                    TREATMENT:   Self-Care:  Vitals:   11/23/24 1427  BP: 110/70  Pulse: 95   -provided mom with PCP info  -time spent explaining pathophys of WE and patients current symptom presentation  -need for medication/medical management -mom attempting to call new PCP during appt to change to earlier appt (per mom, no earlier appt was available)  -time spent adjusting patients schedule to 1x/ week every other week with robust HEP due to self pay and need for further medical management   NMR: -seated monocular small saccades  -frequent errors needing anchor to keep place  -seated smooth pursuits  -increased vertical upbeating nystagmus with upward tracking -reduction in amplitude of upbeating vertical nystagmus with horizontal tracking  -seated NPC with straw in cup + weighted dowels -self selecting cervical extension    PATIENT  EDUCATION: Education details: See above, PT POC, added to HEP  Person educated: Patient Education method: Programmer, Multimedia, Demonstration, Verbal cues, and Handouts Education comprehension: verbalized understanding, returned demonstration, and needs further education  HOME EXERCISE PROGRAM: -static fixation to target placed on plain wall ~79ft in front  Access Code: [email or web address] URL: [email or web address] Date: 11/14/2024 Prepared by: Sheffield Senate  Exercises - Seated Horizontal Smooth Pursuit  - 1 x daily - 7 x weekly - 2 sets - 5 reps - Seated Vertical Smooth Pursuit  - 1 x daily - 7 x weekly - 2 sets - 5 reps - Seated Horizontal Saccades  - 1 x daily - 7 x weekly - 2 sets - 10 reps (one eye, and both eyes) - Seated Vertical Saccades  - 1 x daily - 7 x weekly - 2 sets - 10 reps(one eye, and both eyes) Oculomotor: Smooth Pursuits    Holding a target, keep eyes on target and slowly move target side to side with head still. Perform sitting. Repeat ___10_ times per session. Do __2__ sessions per day.  *make sure your head is kept level *try to use a plain background   GOALS: Goals reviewed with patient? Yes  SHORT TERM GOALS: Target date: 11/29/2024   Pt will be independent with initial HEP for improved strength, balance, transfers and gait.  Baseline: not established on eval  Goal status: INITIAL  2.  Pt will improve gait speed with SPC to at least 1.8 ft/sec in order to demo decr fall risk.   Baseline: with SPC: 23.5 seconds = 1.40 ft/sec Goal status: INITIAL  3.  MCTSIB to be  assessed and STG/LTG updated  Baseline: LTG written  Goal status: MET  4.  FGA to be assessed and STG/LTG updated  Baseline:  Goal status: INITIAL    LONG TERM GOALS: Target date: 12/13/2024   Pt will be independent with final HEP for improved strength, balance, transfers and gait.  Baseline:  Goal status: INITIAL  2.  Pt will improve gait speed with SPC to at least 2.2 ft/sec  in order to demo improved community mobility. Baseline: with SPC: 23.5 seconds = 1.40 ft/sec Goal status: INITIAL  3.  Pt will improve condition 4 of mCTSIB to at least 30 seconds in order to demo improved vestibular system for balance.  Baseline: 20 Sec, Mild Sway, pt reporting dizziness and unsteadiness Goal status: INITIAL  4.  FGA goal  Baseline:  Goal status: INITIAL  5.  Pt will improve 5 x STS to less than or equal to 12 seconds w/reduced UE support to demonstrate improved functional strength and transfer efficiency.   Baseline: 15.01s w/BUE support Goal status: INITIAL   ASSESSMENT:  CLINICAL IMPRESSION: Patient seen for skilled PT session with emphasis on patient/family education and vision retraining. Continues to demonstrate varying amplitude vertical nystagmus with upbeating. This increases in amplitude with increased eye work and near work. Discussed extensively with family need for further medical management. Patient was, unfortunately, sent home from hospital without immediate neuro for PCP follow up and so has been rather lost to follow up necessitating more time spent in PT for case management. Discussed that balance will continue to suffer so long as vertical nystagmus persists. Patient and mom agreeable to changing PT frequency to 1x a week every other week due to insurance and medical management limitations. Continue POC.    OBJECTIVE IMPAIRMENTS: Abnormal gait, decreased activity tolerance, decreased balance, decreased cognition, decreased coordination, decreased knowledge of condition, decreased knowledge of use of DME, decreased mobility, difficulty walking, decreased strength, decreased safety awareness, impaired sensation, impaired vision/preception, and improper body mechanics  ACTIVITY LIMITATIONS: carrying, lifting, bending, standing, squatting, stairs, transfers, bed mobility, locomotion level, and caring for others  PARTICIPATION LIMITATIONS: meal prep,  cleaning, laundry, interpersonal relationship, driving, shopping, community activity, occupation, and yard work  PERSONAL FACTORS: Past/current experiences, Transportation, and 1 comorbidity: Wernicke's Encephalopathy are also affecting patient's functional outcome.   REHAB POTENTIAL: Good  CLINICAL DECISION MAKING: Unstable/unpredictable  EVALUATION COMPLEXITY: High  PLAN:  PT FREQUENCY: 1-2x/week  PT DURATION: 6 weeks  PLANNED INTERVENTIONS: 02835- PT Re-evaluation, 97750- Physical Performance Testing, 97110-Therapeutic exercises, 97530- Therapeutic activity, [id number]- Neuromuscular re-education, 97535- Self Care, 02859- Manual therapy, [phone]- Gait training, [phone]- Canalith repositioning, [id number]- Aquatic Therapy, [phone]- Electrical stimulation (manual), [phone] (1-2 muscles), [phone] (3+ muscles)- Dry Needling, Patient/Family education, Balance training, Stair training, Joint mobilization, Spinal mobilization, Vestibular training, Visual/preceptual remediation/compensation, Cognitive remediation, and DME instructions  PLAN FOR NEXT SESSION:  Have really tried to get pt in to see PCP or neurologist earlier but has had no luck. Any update on neurologist appt?   Static fixation on target on plain background. Very short saccades, likely monocularly. Did do slightly better in dark room in terms of vertical nystagmus. Continue oculomotor exercises as appropriate. Have not been able to find any null point, has consistent vertical nystagmus. Work on balance with EC on foam as that was challenging per mCTSIB    Delon DELENA Pop, PT Delon DELENA Pop, PT, DPT, CBIS, CFVRS  11/23/2024, 3:06 PM

## 2024-11-25 ENCOUNTER — Ambulatory Visit: Payer: MEDICAID | Admitting: Physical Therapy

## 2024-11-30 ENCOUNTER — Ambulatory Visit: Payer: Self-pay

## 2024-12-02 ENCOUNTER — Ambulatory Visit: Payer: Self-pay

## 2024-12-05 ENCOUNTER — Ambulatory Visit: Payer: MEDICAID | Admitting: Physical Therapy

## 2024-12-05 ENCOUNTER — Telehealth: Payer: Self-pay | Admitting: Neurology

## 2024-12-05 ENCOUNTER — Ambulatory Visit: Payer: Self-pay | Admitting: Physical Therapy

## 2024-12-05 ENCOUNTER — Encounter: Payer: Self-pay | Admitting: Physical Therapy

## 2024-12-05 VITALS — BP 109/71 | HR 60

## 2024-12-05 DIAGNOSIS — R42 Dizziness and giddiness: Secondary | ICD-10-CM

## 2024-12-05 DIAGNOSIS — R2681 Unsteadiness on feet: Secondary | ICD-10-CM

## 2024-12-05 DIAGNOSIS — R2689 Other abnormalities of gait and mobility: Secondary | ICD-10-CM
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
## 2024-12-05 NOTE — Telephone Encounter (Signed)
 Pt came in from Neuro rehab next door, states his therapist wanted him to come over and get a sooner appt with Dr at [facility]. Pt states therapist told him he is not allowed to do therapy 2x a week until he is seen by a doctor at [facility]. Told him Dr. Onita is now booked out until May 2026, let pt know I would put a note in and see if nurses could work him in sooner than current appt in April 2026. Would like a call if he can be seen sooner.  [phone]

## 2024-12-06 NOTE — Telephone Encounter (Signed)
 This is a new patient. Can this be reviewed by referrals for sooner appt availability?

## 2024-12-08 ENCOUNTER — Ambulatory Visit: Payer: Self-pay

## 2024-12-13 ENCOUNTER — Ambulatory Visit: Payer: Self-pay | Admitting: Physical Therapy

## 2024-12-19 ENCOUNTER — Ambulatory Visit: Payer: Self-pay

## 2024-12-28 ENCOUNTER — Encounter: Payer: Self-pay | Admitting: Neurology

## 2024-12-28 ENCOUNTER — Ambulatory Visit: Payer: Self-pay | Admitting: Neurology

## 2024-12-28 VITALS — BP 115/67 | HR 82 | Ht 73.0 in | Wt 161.4 lb

## 2024-12-28 DIAGNOSIS — E512 Wernicke's encephalopathy: Secondary | ICD-10-CM | POA: Diagnosis not present

## 2024-12-28 DIAGNOSIS — R269 Unspecified abnormalities of gait and mobility: Secondary | ICD-10-CM | POA: Diagnosis not present

## 2024-12-28 HISTORY — DX: Wernicke's encephalopathy: E51.2

## 2024-12-28 MED ORDER — THIAMINE HCL 100 MG PO TABS: 100.0000 mg | ORAL_TABLET | Freq: Every day | ORAL | 3 refills | Status: AC
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
## 2024-12-30 ENCOUNTER — Encounter: Payer: Self-pay | Admitting: Nurse Practitioner

## 2024-12-30 ENCOUNTER — Ambulatory Visit: Payer: Self-pay | Admitting: Nurse Practitioner

## 2024-12-30 VITALS — BP 122/72 | HR 74 | Wt 160.0 lb

## 2024-12-30 DIAGNOSIS — R269 Unspecified abnormalities of gait and mobility: Secondary | ICD-10-CM

## 2024-12-30 DIAGNOSIS — Z0000 Encounter for general adult medical examination without abnormal findings: Secondary | ICD-10-CM | POA: Insufficient documentation

## 2024-12-30 DIAGNOSIS — E512 Wernicke's encephalopathy: Secondary | ICD-10-CM | POA: Diagnosis not present

## 2024-12-30 DIAGNOSIS — S0269XS Fracture of mandible of other specified site, sequela: Secondary | ICD-10-CM | POA: Diagnosis not present

## 2024-12-30 DIAGNOSIS — E876 Hypokalemia: Secondary | ICD-10-CM | POA: Insufficient documentation

## 2024-12-30 DIAGNOSIS — R2681 Unsteadiness on feet: Secondary | ICD-10-CM | POA: Diagnosis not present

## 2024-12-30 DIAGNOSIS — F17200 Nicotine dependence, unspecified, uncomplicated: Secondary | ICD-10-CM | POA: Diagnosis not present

## 2024-12-30 DIAGNOSIS — F1091 Alcohol use, unspecified, in remission: Secondary | ICD-10-CM | POA: Diagnosis not present

## 2024-12-30 NOTE — Assessment & Plan Note (Signed)
"  Rechecking labs  "

## 2024-12-30 NOTE — Assessment & Plan Note (Signed)
 Replaced at the hospital Rechecking labs Lab Results  Component Value Date   NA 141 10/12/2024   K 3.0 (L) 10/12/2024   CO2 25 10/12/2024   GLUCOSE 96 10/12/2024   BUN 5 (L) 10/12/2024   CREATININE 0.95 10/12/2024   CALCIUM 8.9 10/12/2024   GFRNONAA >60 10/12/2024

## 2024-12-30 NOTE — Assessment & Plan Note (Addendum)
 Status post mandibular fracture Mandibular fracture with hardware removed.  Denies any concerns today

## 2024-12-30 NOTE — Assessment & Plan Note (Signed)
 Currently undergoing physical therapy Uses a cane for ambulation

## 2024-12-30 NOTE — Assessment & Plan Note (Signed)
 Vaccine record reviewed - Encouraged administration of hepatitis B, HPV, and tetanus vaccines.

## 2024-12-30 NOTE — Assessment & Plan Note (Signed)
" °  Current daily smoking. Discussed risks including lung cancer and heart disease. - Encouraged smoking cessation.  "

## 2024-12-30 NOTE — Assessment & Plan Note (Signed)
"   Under neurologist care with ongoing physical therapy. - Continue physical therapy once a week. - Encouraged hydration. - Follow up with neurologist in July. Continue thiamine  100 mg daily, multiple vitamins with minerals, folic acid    "

## 2024-12-30 NOTE — Assessment & Plan Note (Signed)
 Alcohol dependence, in remission Alcohol dependence in remission. Abstinent and encouraged to maintain sobriety. - Encouraged continued abstinence from alcohol.

## 2024-12-30 NOTE — Patient Instructions (Signed)
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
## 2024-12-31 ENCOUNTER — Ambulatory Visit: Payer: Self-pay | Admitting: Nurse Practitioner

## 2024-12-31 LAB — MAGNESIUM: Magnesium: 2 mg/dL (ref 1.6–2.3)

## 2024-12-31 LAB — POTASSIUM: Potassium: 4.8 mmol/L (ref 3.5–5.2)

## 2025-01-02 ENCOUNTER — Ambulatory Visit: Attending: Internal Medicine | Admitting: Physical Therapy

## 2025-01-02 ENCOUNTER — Encounter: Payer: Self-pay | Admitting: Physical Therapy

## 2025-01-02 VITALS — BP 116/67 | HR 56

## 2025-01-02 DIAGNOSIS — R42 Dizziness and giddiness: Secondary | ICD-10-CM | POA: Insufficient documentation

## 2025-01-02 DIAGNOSIS — M6281 Muscle weakness (generalized): Secondary | ICD-10-CM | POA: Insufficient documentation

## 2025-01-02 DIAGNOSIS — R2689 Other abnormalities of gait and mobility: Secondary | ICD-10-CM | POA: Diagnosis present

## 2025-01-02 DIAGNOSIS — R2681 Unsteadiness on feet: Secondary | ICD-10-CM | POA: Diagnosis present

## 2025-01-02 NOTE — Therapy (Unsigned)
 " OUTPATIENT PHYSICAL THERAPY NEURO TREATMENT/RE-CERT   Patient Name: [name] MRN: [id number] DOB:09/20/1986, 39 y.o., male Today's Date: 01/03/2025   PCP: None REFERRING PROVIDER: Arlice Reichert, MD  END OF SESSION:  PT End of Session - 01/02/25 1407     Visit Number 7    Number of Visits 11    Date for Recertification  03/04/25   per re-cert, due to delay in scheduling   Authorization Type [employer] MEDICAID    PT Start Time 1404    PT Stop Time 1444    PT Time Calculation (min) 40 min    Equipment Utilized During Treatment Gait belt    Activity Tolerance Patient tolerated treatment well    Behavior During Therapy [facility] for tasks assessed/performed          Past Medical History:  Diagnosis Date   Asthma    Wernicke encephalopathy 12/28/2024   Past Surgical History:  Procedure Laterality Date   ORIF MANDIBULAR FRACTURE N/A 08/23/2024   Procedure: OPEN REDUCTION INTERNAL FIXATION (ORIF) MANDIBULAR FRACTURE;  Surgeon: Roark Rush, MD;  Location: [facility] OR;  Service: ENT;  Laterality: N/A;   Patient Active Problem List   Diagnosis Date Noted   Alcohol use disorder in remission 12/30/2024   Fracture of mandible of other specified site, sequela 12/30/2024   Unsteadiness on feet 12/30/2024   Hypokalemia 12/30/2024   Hypomagnesemia 12/30/2024   Tobacco use disorder 12/30/2024   Health care maintenance 12/30/2024   Gait abnormality 12/28/2024   Wernicke encephalopathy 12/28/2024   Intractable nausea and vomiting 10/09/2024   Encounter for psychological evaluation 08/10/2023    ONSET DATE: 10/12/2024 (referral)   REFERRING DIAG: H51.23 (ICD-10-CM) - INO (internuclear ophthalmoplegia), bilateral R27.0 (ICD-10-CM) - Ataxia  THERAPY DIAG:  Unsteadiness on feet  Other abnormalities of gait and mobility  Dizziness and giddiness  Muscle weakness (generalized)  Rationale for Evaluation and Treatment: Rehabilitation  SUBJECTIVE:                                                                                                                                                                                              SUBJECTIVE STATEMENT: Saw Dr. Onita and reports that he gave him more thiamine , but pt was already taking it. Pt follows back up in 6 months. Saw his PCP and they checked his potassium and magnesium levels and they came back normal. Has not really noticed a difference in his nystagmus. Has fallen 2-3 times to the couch. Felt off balance standing up and then fell to the couch. Exercises are going well, but still not noticing a difference. Has tried the  balance exercises, but that feels the same.   Pt accompanied by: self  PERTINENT HISTORY: etoh abuse, recent mandibular fx from trauma w/ internal fixation, Wernicke's Encephalopathy   PAIN:  Are you having pain? No Has some pain in the back of his legs thinks its from being dizzy and walking   PRECAUTIONS: Fall  PATIENT GOALS: Just do the therapy you know and work on my legs and ankles   OBJECTIVE:  Note: Objective measures were completed at Evaluation unless otherwise noted.  DIAGNOSTIC FINDINGS: MRI of brain from 10/10/24  IMPRESSION: 1. Bithalamic hyperintense T2-weighted signal consistent with Wernicke encephalopathy.  CT maxillofacial from 10/08/24 IMPRESSION: 1. No acute facial fracture. 2. Subacute displaced right mandibular fracture at the junction of the condyle and ramus - minimal periosteal reaction. Plate and screw fixation of a subacute displaced and comminuted left mandibular body fracture. Plate and screw fixation of the maxilla and mandible, likely temporary.   [facility] PT Assessment - 01/02/25 1436       Functional Gait  Assessment   Gait assessed  Yes    Gait Level Surface Walks 20 ft, slow speed, abnormal gait pattern, evidence for imbalance or deviates 10-15 in outside of the 12 in walkway width. Requires more than 7 sec to ambulate 20 ft.   11.2   Change in Gait  Speed Makes only minor adjustments to walking speed, or accomplishes a change in speed with significant gait deviations, deviates 10-15 in outside the 12 in walkway width, or changes speed but loses balance but is able to recover and continue walking.    Gait with Horizontal Head Turns Performs head turns with moderate changes in gait velocity, slows down, deviates 10-15 in outside 12 in walkway width but recovers, can continue to walk.    Gait with Vertical Head Turns Performs task with moderate change in gait velocity, slows down, deviates 10-15 in outside 12 in walkway width but recovers, can continue to walk.    FGA comment: performed with cane          VITALS  Vitals:   01/02/25 1419  BP: 116/67  Pulse: (!) 56                                                                                                                       TREATMENT:    Self-Care/Therapeutic Activity:   Vitals:   01/02/25 1419  BP: 116/67  Pulse: (!) 56   Goal Assessment:  5x sit <> stand: 25 seconds with no UE support, 19.5 seconds with BUE support  Gait speed: 15.1 seconds = 2.2 ft/sec     M-CTSIB  Condition 1: Firm Surface, EO 30 Sec, Normal Sway  Condition 2: Firm Surface, EC 22 Sec, Mild Sway  Condition 3: Foam Surface, EO 30 Sec, Normal Sway  Condition 4: Foam Surface, EC 4 Sec    [facility] PT Assessment - 01/02/25 1436       Functional Gait  Assessment   Gait assessed  Yes    Gait Level Surface Walks 20 ft, slow speed, abnormal gait pattern, evidence for imbalance or deviates 10-15 in outside of the 12 in walkway width. Requires more than 7 sec to ambulate 20 ft.   11.2   Change in Gait Speed Makes only minor adjustments to walking speed, or accomplishes a change in speed with significant gait deviations, deviates 10-15 in outside the 12 in walkway width, or changes speed but loses balance but is able to recover and continue walking.    Gait with Horizontal Head Turns Performs head turns with  moderate changes in gait velocity, slows down, deviates 10-15 in outside 12 in walkway width but recovers, can continue to walk.    Gait with Vertical Head Turns Performs task with moderate change in gait velocity, slows down, deviates 10-15 in outside 12 in walkway width but recovers, can continue to walk.    FGA comment: performed with cane          Will finish FGA at future session     Importance of performing HEP daily to see if it will help with any oculomotor function despite consistent vertical nystagmus as pt has only performed a couple times since last here  Continued to educate importance of medical management from PCP/neurology as PT only is not going to make a difference in pt's symptoms due to current symptom presentation with Wernicke's Encephalopathy and consistent, unchanged vertical nystagmus that significantly affects pt's balance Discussed trying to see if pt can be placed on waiting list for neurology or be seen for an earlier appt (appt is not until April) Pt has first PCP appt (pt is establishing care) at the beginning of January. Pt is currently still listed as self-pay, will go on hold with PT at this time until after pt sees his PCP about hospital follow-up for Wernicke's Encephalopathy, as pt current has robust HEP that he can perform at home for balance/oculomotor exercises as PT is currently limited in progressing pt until pt receives further medical follow-up/management   PATIENT EDUCATION: Education details: See self-care above, going on hold and scheduling an appt 1 month away until after pt sees PCP, performing HEP daily, standing corner balance additions  Person educated: Patient Education method: Explanation, Demonstration, Verbal cues, and Handouts Education comprehension: verbalized understanding, returned demonstration, and needs further education  HOME EXERCISE PROGRAM: -static fixation to target placed on plain wall ~42ft in front  Access Code:  [email or web address] URL: [email or web address] Date: 11/14/2024 Prepared by: Sheffield Senate  Exercises - Seated Horizontal Smooth Pursuit  - 1 x daily - 7 x weekly - 2 sets - 5 reps - Seated Vertical Smooth Pursuit  - 1 x daily - 7 x weekly - 2 sets - 5 reps - Seated Horizontal Saccades  - 1 x daily - 7 x weekly - 2 sets - 10 reps (one eye, and both eyes) - Seated Vertical Saccades  - 1 x daily - 7 x weekly - 2 sets - 10 reps(one eye, and both eyes) - Standing Balance with Eyes Closed on Foam  - 1 x daily - 5 x weekly - 2 sets - 30 hold - Wide Stance with Eyes Closed and Head Rotation on Foam Pad  - 1 x daily - 5 x weekly - 2 sets - 10 reps - Romberg Stance on Foam Pad with Head Rotation  - 1 x daily - 5 x weekly - 2 sets - 10 reps Oculomotor: Smooth Pursuits    Holding a  target, keep eyes on target and slowly move target side to side with head still. Perform sitting. Repeat ___10_ times per session. Do __2__ sessions per day.  *make sure your head is kept level *try to use a plain background   GOALS: Goals reviewed with patient? Yes  SHORT TERM GOALS: Target date: 11/29/2024   Pt will be independent with initial HEP for improved strength, balance, transfers and gait.  Baseline: providing HEP, but pt has not been consistently performing  Goal status: INITIAL  2.  Pt will improve gait speed with SPC to at least 1.8 ft/sec in order to demo decr fall risk.   Baseline: with SPC: 23.5 seconds = 1.40 ft/sec Goal status: INITIAL  3.  MCTSIB to be assessed and STG/LTG updated  Baseline: LTG written  Goal status: MET  4.  FGA to be assessed and STG/LTG updated  Baseline:  Goal status: INITIAL    LONG TERM GOALS: Target date: 12/13/2024   Pt will be independent with final HEP for improved strength, balance, transfers and gait.  Baseline: pt reports he has been trying to perform at home  Goal status: ON-GOING  2.  Pt will improve gait speed with SPC to at least 2.2  ft/sec in order to demo improved community mobility. Baseline: with SPC: 23.5 seconds = 1.40 ft/sec  15.1 seconds = 2.2 ft/sec (1/12) Goal status: MET  3.  Pt will improve condition 4 of mCTSIB to at least 30 seconds in order to demo improved vestibular system for balance.  Baseline: 20 Sec, Mild Sway, pt reporting dizziness and unsteadiness  4 seconds  Goal status: NOT MET  4.  FGA goal  Baseline:  Goal status: INITIAL  5.  Pt will improve 5 x STS to less than or equal to 12 seconds w/reduced UE support to demonstrate improved functional strength and transfer efficiency.   Baseline: 15.01s w/BUE support  25 seconds with no UE support, 19.5 seconds with BUE support (1/12) Goal status: NOT MET   ASSESSMENT:  CLINICAL IMPRESSION: Today's skilled PT session continued to focus heavily on education and standing balance tasks. Pt treated in a lightly dimmed room as pt able to tolerate better with nystagmus instead of being out in the bright therapy gym. Pt continues to demonstrate constant vertical nystagmus and has not had any improvement since starting PT. When performing visual tasks, pt does tend to hold neck in an extended position. When pt was discharged from the hospital, he was sent home without immediate neuro and PCP follow-up, so extensive time in PT has been spent for case management and continued education regarding why pt needs further medical follow-up and that PT alone won't help with his symptoms. Continued to educate pt that balance will continue to be impaired as long as pt has vertical nystagmus. PT has provided all that they can at this time regarding an HEP for balance and oculomotor tasks and educated to perform daily. Pt continues to be self-pay at this time and is waiting to receive his Medicaid card. Will go on hold for the next month for pt to work on his HEP at home and until after he sees his PCP and potentially neurology if he is able to be seen earlier than April.     OBJECTIVE IMPAIRMENTS: Abnormal gait, decreased activity tolerance, decreased balance, decreased cognition, decreased coordination, decreased knowledge of condition, decreased knowledge of use of DME, decreased mobility, difficulty walking, decreased strength, decreased safety awareness, impaired sensation, impaired vision/preception, and improper  body mechanics  ACTIVITY LIMITATIONS: carrying, lifting, bending, standing, squatting, stairs, transfers, bed mobility, locomotion level, and caring for others  PARTICIPATION LIMITATIONS: meal prep, cleaning, laundry, interpersonal relationship, driving, shopping, community activity, occupation, and yard work  PERSONAL FACTORS: Past/current experiences, Transportation, and 1 comorbidity: Wernicke's Encephalopathy are also affecting patient's functional outcome.   REHAB POTENTIAL: Good  CLINICAL DECISION MAKING: Unstable/unpredictable  EVALUATION COMPLEXITY: High  PLAN:  PT FREQUENCY: 1-2x/week  PT DURATION: 6 weeks  PLANNED INTERVENTIONS: 02835- PT Re-evaluation, 97750- Physical Performance Testing, 97110-Therapeutic exercises, 97530- Therapeutic activity, [id number]- Neuromuscular re-education, 97535- Self Care, 02859- Manual therapy, [phone]- Gait training, [phone]- Canalith repositioning, [id number]- Aquatic Therapy, [phone]- Electrical stimulation (manual), [phone] (1-2 muscles), [id number] (3+ muscles)- Dry Needling, Patient/Family education, Balance training, Stair training, Joint mobilization, Spinal mobilization, Vestibular training, Visual/preceptual remediation/compensation, Cognitive remediation, and DME instructions  PLAN FOR NEXT SESSION:  pt going on hold for a month after he sees PCP Will need to check goals and do re-cert   Very short saccades, likely monocularly. Continue oculomotor exercises as appropriate. Work on balance with EC on foam as that was challenging per mCTSIB    Sheffield Senate, PT, DPT 01/03/2025 12:54 PM        "

## 2025-01-04 ENCOUNTER — Telehealth: Payer: Self-pay | Admitting: Physical Therapy

## 2025-01-04 DIAGNOSIS — R42 Dizziness and giddiness: Secondary | ICD-10-CM | POA: Insufficient documentation

## 2025-01-04 DIAGNOSIS — M6281 Muscle weakness (generalized): Secondary | ICD-10-CM | POA: Insufficient documentation

## 2025-01-04 DIAGNOSIS — R2681 Unsteadiness on feet: Secondary | ICD-10-CM

## 2025-01-04 DIAGNOSIS — E512 Wernicke's encephalopathy: Secondary | ICD-10-CM

## 2025-01-04 MED ORDER — MEMANTINE HCL 10 MG PO TABS: 10.0000 mg | ORAL_TABLET | Freq: Two times a day (BID) | ORAL | 11 refills | Status: AC

## 2025-01-04 NOTE — Telephone Encounter (Signed)
 Dr. Onita,  I have been working with your recent patient [name] in physical therapy for deficits from Wernicke's Encephalopathy. His physical therapy prognosis continues to be limited with his balance due to continued consistent vertical nystagmus. I was wondering if you would be able to put in a referral for neuro-ophthalmology regarding these central visual deficits. And I was wondering if you think if any medication would be beneficial to help with this continued nystagmus and improve his function?  Thanks so much,  Sheffield Senate, PT, DPT 01/04/2025 12:23 PM    [facility] [address] [place], [place]  [address] Phone:  [phone] Fax:  [phone]

## 2025-01-04 NOTE — Telephone Encounter (Signed)
 Chronic nystagmus following Wernicke's encephalopathy often persists despite thiamine  repletion and requires pharmacological management tailored to the nystagmus type. Approximately 60% of patients retain residual fine horizontal nystagmus after treatment, and some develop permanent downbeat nystagmus due to irreversible damage to paramedian tract neurons projecting to the cerebellar flocculus. [1-2] This conversion from acute upbeat to chronic downbeat nystagmus reflects selective vulnerability and incomplete recovery of brainstem gaze-holding networks. [2-3]  Continued high-dose thiamine  remains essential to prevent further neurological deterioration, even when nystagmus has become chronic. [1][4] However, once nystagmus persists beyond the acute phase, additional symptomatic treatments are typically required.  For acquired pendular nystagmus, gabapentin (up to 900 mg/day) and memantine  (40 mg/day) are first-line pharmacological options. Gabapentin significantly reduces nystagmus intensity and improves visual acuity in pendular nystagmus, with 10 of 15 patients in one trial showing substantial improvement. [5] Memantine  reduced median eye speed by 27.8% across various nystagmus types in a crossover trial. [6] Both agents are generally well-tolerated, though gabapentin may cause unsteadiness and memantine  can cause lethargy.  Meds ordered this encounter  Medications   memantine  (NAMENDA ) 10 MG tablet    Sig: Take 1 tablet (10 mg total) by mouth 2 (two) times daily.    Dispense:  60 tablet    Refill:  11     Orders Placed This Encounter  Procedures   Ambulatory referral to Ophthalmology   Please let patient know I have referred him to ophthalmology department at [facility] for second opinion of his nystagmus, difficulty focusing,  Also add on Namenda  10 mg twice a day to see if it provide any help

## 2025-01-05 NOTE — Telephone Encounter (Signed)
 Spoke with patient mother Ellouise ( had DPR access) informed with all the below.  Mother said that the Vitamin B1 (100mg ) is OTC per pharmacist. They will take OTC. She will pick up  Namenda  10 mg tablet.

## 2025-01-05 NOTE — Telephone Encounter (Signed)
 Left message for patient to call.

## 2025-01-17 ENCOUNTER — Ambulatory Visit: Admitting: Physical Therapy

## 2025-01-25 ENCOUNTER — Encounter: Payer: Self-pay | Admitting: Physical Therapy

## 2025-01-25 ENCOUNTER — Ambulatory Visit: Admitting: Physical Therapy

## 2025-01-25 VITALS — BP 119/77 | HR 79

## 2025-01-25 DIAGNOSIS — R2681 Unsteadiness on feet: Secondary | ICD-10-CM

## 2025-01-25 DIAGNOSIS — M6281 Muscle weakness (generalized): Secondary | ICD-10-CM

## 2025-01-25 DIAGNOSIS — R2689 Other abnormalities of gait and mobility: Secondary | ICD-10-CM

## 2025-01-25 NOTE — Therapy (Signed)
 " OUTPATIENT PHYSICAL THERAPY NEURO TREATMENT   Patient Name: [name] MRN: [id number] DOB:1986-02-12, 39 y.o., male Today's Date: 01/25/2025   PCP: None REFERRING PROVIDER: Arlice Reichert, MD (cert sent to Dr. Onita, neurologist)  END OF SESSION:  PT End of Session - 01/25/25 1406     Visit Number 8    Number of Visits 11    Date for Recertification  03/04/25   per re-cert, due to delay in scheduling   Authorization Type [employer] MEDICAID    PT Start Time 1405    PT Stop Time 1443    PT Time Calculation (min) 38 min    Equipment Utilized During Treatment Gait belt    Activity Tolerance Patient tolerated treatment well    Behavior During Therapy WFL for tasks assessed/performed          Past Medical History:  Diagnosis Date   Asthma    Wernicke encephalopathy 12/28/2024   Past Surgical History:  Procedure Laterality Date   ORIF MANDIBULAR FRACTURE N/A 08/23/2024   Procedure: OPEN REDUCTION INTERNAL FIXATION (ORIF) MANDIBULAR FRACTURE;  Surgeon: Roark Rush, MD;  Location: [facility] OR;  Service: ENT;  Laterality: N/A;   Patient Active Problem List   Diagnosis Date Noted   Muscle weakness (generalized) 01/04/2025   Dizziness and giddiness 01/04/2025   Alcohol use disorder in remission 12/30/2024   Fracture of mandible of other specified site, sequela 12/30/2024   Unsteadiness on feet 12/30/2024   Hypokalemia 12/30/2024   Hypomagnesemia 12/30/2024   Tobacco use disorder 12/30/2024   Health care maintenance 12/30/2024   Gait abnormality 12/28/2024   Wernicke encephalopathy 12/28/2024   Intractable nausea and vomiting 10/09/2024   Encounter for psychological evaluation 08/10/2023    ONSET DATE: 10/12/2024 (referral)   REFERRING DIAG: H51.23 (ICD-10-CM) - INO (internuclear ophthalmoplegia), bilateral R27.0 (ICD-10-CM) - Ataxia  THERAPY DIAG:  Unsteadiness on feet  Muscle weakness (generalized)  Other abnormalities of gait and mobility  Rationale for Evaluation and  Treatment: Rehabilitation  SUBJECTIVE:                                                                                                                                                                                             SUBJECTIVE STATEMENT: Had 1 fall, was trying to get something out of the cabinet and fell backwards. Tried taking the Namenda  for a week for his nystagmus and notes it has not been helping. Was also referred for neuro-ophthalmology, but has not been scheduled. Reports exercises are still challenging.   Pt accompanied by: self  PERTINENT HISTORY: etoh abuse, recent mandibular fx from trauma w/ internal fixation,  Wernicke's Encephalopathy   PAIN:  Are you having pain? No Has some pain in the back of his legs thinks its from being dizzy and walking   PRECAUTIONS: Fall  PATIENT GOALS: Just do the therapy you know and work on my legs and ankles   OBJECTIVE:  Note: Objective measures were completed at Evaluation unless otherwise noted.  DIAGNOSTIC FINDINGS: MRI of brain from 10/10/24  IMPRESSION: 1. Bithalamic hyperintense T2-weighted signal consistent with Wernicke encephalopathy.  CT maxillofacial from 10/08/24 IMPRESSION: 1. No acute facial fracture. 2. Subacute displaced right mandibular fracture at the junction of the condyle and ramus - minimal periosteal reaction. Plate and screw fixation of a subacute displaced and comminuted left mandibular body fracture. Plate and screw fixation of the maxilla and mandible, likely temporary.     VITALS  Vitals:   01/25/25 1415  BP: 119/77  Pulse: 79                                                                                                                        TREATMENT:    Self-Care/Therapeutic Activity:   Vitals:   01/25/25 1415  BP: 119/77  Pulse: 79    Discussed light sensitivity glasses and showed pt where to purchase from [employer] as pt's eyes still sensitive to bright lights   PT  continuing to provide education regarding Wernicke's Encephalopathy and continued central deficits afterwards and that PT is not going to make a change in pt's continued vertical nystagmus and that pt's balance will continue to be impaired due to nystagmus. Educated on why pt is taking thiamine . Discussed with pt to reach out to Dr. Onita regarding neuro-ophthalmology referral as this has not been scheduled yet    Access Code: [email or web address] URL: [email or web address] Date: 01/25/2025 Prepared by: Sheffield Senate  Updated/revised exercises to focus more on balance and functional strength rather than trying to address oculomotor function as those previous exercises were not successful   Exercises - Standing Balance with Eyes Closed on Foam  - 1 x daily - 5 x weekly - 2 sets - 30 hold - Wide Stance with Eyes Closed and Head Rotation on Foam Pad  - 1 x daily - 5 x weekly - 2 sets - 10 reps and 10 reps head nods  - Romberg Stance on Foam Pad with Head Rotation  - 1 x daily - 5 x weekly - 2 sets - 10 reps and 10 reps head nods  - Sit to Stand  - 2 x daily - 5 x weekly - 2 sets - 5 reps - performed with no UE support  - Heel Toe Raises with Counter Support  - 1 x daily - 5 x weekly - 2 sets - 10 reps - Standing Marching  - 1 x daily - 5 x weekly - 2 sets - 10 reps - at countertop   On air ex: 10 reps sit <> stands with no UE support 10 reps heel raises with intermittent UE support  PATIENT EDUCATION: Education details: See self-care above, revised HEP to focus more on functional strength/balance and provided new handout  Person educated: Patient Education method: Explanation, Demonstration, Verbal cues, and Handouts Education comprehension: verbalized understanding, returned demonstration, and needs further education  HOME EXERCISE PROGRAM: Access Code: [email or web address] URL: [email or web address] Date: 01/25/2025 Prepared by: Sheffield Senate  Exercises - Standing Balance  with Eyes Closed on Foam  - 1 x daily - 5 x weekly - 2 sets - 30 hold - Wide Stance with Eyes Closed and Head Rotation on Foam Pad  - 1 x daily - 5 x weekly - 2 sets - 10 reps - Romberg Stance on Foam Pad with Head Rotation  - 1 x daily - 5 x weekly - 2 sets - 10 reps - Sit to Stand  - 2 x daily - 5 x weekly - 2 sets - 5 reps - Heel Toe Raises with Counter Support  - 1 x daily - 5 x weekly - 2 sets - 10 reps - Standing Marching  - 1 x daily - 5 x weekly - 2 sets - 10 reps  GOALS: Goals reviewed with patient? Yes   UPDATED/ON-GOING LTGS FOR RE-CERT:  LONG TERM GOALS: Target date: 02/02/2025   Pt will be independent with final HEP for improved strength, balance, transfers and gait.  Baseline: pt reports he has been trying to perform at home  Goal status: ON-GOING  2.  Pt will improve gait speed with SPC to at least 2.5 ft/sec in order to demo improved community mobility. Baseline: with SPC: 23.5 seconds = 1.40 ft/sec  15.1 seconds = 2.2 ft/sec (1/12) Goal status: REVISED  3.  Pt will improve condition 4 of mCTSIB to at least 15 seconds in order to demo improved vestibular system for balance.  Baseline: 20 Sec, Mild Sway, pt reporting dizziness and unsteadiness  4 seconds  Goal status: REVISED  4.  FGA goal  Baseline: did not have time to finish on 01/05/25 Goal status: PROGRESSING  5.  Pt will improve 5 x STS to less than or equal to 20 seconds with no UE support to demonstrate improved functional strength and transfer efficiency.  Baseline: 15.01s w/BUE support  25 seconds with no UE support, 19.5 seconds with BUE support (1/12) Goal status: REVISED   ASSESSMENT:  CLINICAL IMPRESSION: Since last session, pt was prescribed Namenda  from Dr. Onita to see if it will help with continued downbeating nystagmus. Pt had been taking it for one week and has not noticed a difference yet. Continued to educate regarding continued central deficits from Wernicke's Encephalopathy. Today's  skilled session focused on reviewing/revising HEP to focus primarily on functional strength/balance. Pt fatigues easily with exercises and needing intermittent seated rest breaks. Will continue per POC.   OBJECTIVE IMPAIRMENTS: Abnormal gait, decreased activity tolerance, decreased balance, decreased cognition, decreased coordination, decreased knowledge of condition, decreased knowledge of use of DME, decreased mobility, difficulty walking, decreased strength, decreased safety awareness, impaired sensation, impaired vision/preception, and improper body mechanics  ACTIVITY LIMITATIONS: carrying, lifting, bending, standing, squatting, stairs, transfers, bed mobility, locomotion level, and caring for others  PARTICIPATION LIMITATIONS: meal prep, cleaning, laundry, interpersonal relationship, driving, shopping, community activity, occupation, and yard work  PERSONAL FACTORS: Past/current experiences, Transportation, and 1 comorbidity: Wernicke's Encephalopathy are also affecting patient's functional outcome.   REHAB POTENTIAL: Good  CLINICAL DECISION MAKING: Unstable/unpredictable  EVALUATION COMPLEXITY: High  PLAN:  PT FREQUENCY: 1-2x/week  PT DURATION: 6 weeks, plus 1x week for 8 weeks for  re-cert (4 visits due to delay in scheduling)  PLANNED INTERVENTIONS: [id number]- PT Re-evaluation, 97750- Physical Performance Testing, 97110-Therapeutic exercises, 97530- Therapeutic activity, [id number]- Neuromuscular re-education, 97535- Self Care, 02859- Manual therapy, [phone]- Gait training, [phone]- Canalith repositioning, [id number]- Aquatic Therapy, [phone]- Electrical stimulation (manual), [phone] (1-2 muscles), [phone] (3+ muscles)- Dry Needling, Patient/Family education, Balance training, Stair training, Joint mobilization, Spinal mobilization, Vestibular training, Visual/preceptual remediation/compensation, Cognitive remediation, and DME instructions  PLAN FOR NEXT SESSION:  how is medication for nystagmus? Any  changes? Did he reach out about neuro-ophthalmology referral?   Work on functional strengthening, balance on unlevel surfaces, SLS   Layanna Charo, PT, DPT 01/25/25 2:44 PM   Check all possible CPT codes: 02835 - PT Re-evaluation, 97110- Therapeutic Exercise, [phone]- Neuro Re-education, [phone] - Gait Training, [phone] - Therapeutic Activities, and [phone] - Self Care    Check all conditions that are expected to impact treatment: Neurological condition and/or seizures   If treatment provided at initial evaluation, no treatment charged due to lack of authorization.         "

## 2025-02-01 ENCOUNTER — Ambulatory Visit: Admitting: Physical Therapy

## 2025-02-08 ENCOUNTER — Ambulatory Visit: Admitting: Physical Therapy

## 2025-04-13 ENCOUNTER — Ambulatory Visit: Payer: Self-pay | Admitting: Neurology

## 2025-05-01 ENCOUNTER — Encounter: Payer: Self-pay | Admitting: Nurse Practitioner

## 2025-06-29 ENCOUNTER — Ambulatory Visit: Admitting: Neurology
